# Patient Record
Sex: Female | Born: 1965 | Hispanic: No | Marital: Married | Smoking: Never smoker
Health system: Southern US, Community
[De-identification: ages and names within clinical notes are randomized; demographics above are authoritative.]

---

## 2011-01-16 ENCOUNTER — Other Ambulatory Visit: Payer: Self-pay | Admitting: Obstetrics & Gynecology

## 2011-01-16 ENCOUNTER — Ambulatory Visit (HOSPITAL_COMMUNITY)
Admission: RE | Admit: 2011-01-16 | Discharge: 2011-01-16 | Disposition: A | Discharge status: Home / Self Care | Payer: Self-pay | Source: Ambulatory Visit

## 2011-01-16 ENCOUNTER — Other Ambulatory Visit (HOSPITAL_COMMUNITY): Payer: Self-pay

## 2011-01-16 ENCOUNTER — Ambulatory Visit (HOSPITAL_COMMUNITY): Payer: Self-pay

## 2011-01-16 ENCOUNTER — Inpatient Hospital Stay (HOSPITAL_COMMUNITY)
Admission: AD | Admit: 2011-01-16 | Discharge: 2011-01-16 | Disposition: A | Discharge status: Home / Self Care | Payer: Self-pay | Source: Ambulatory Visit | Attending: Obstetrics & Gynecology | Admitting: Obstetrics & Gynecology

## 2011-01-16 DIAGNOSIS — R58 Hemorrhage, not elsewhere classified: Secondary | ICD-10-CM

## 2011-01-16 DIAGNOSIS — N938 Other specified abnormal uterine and vaginal bleeding: Secondary | ICD-10-CM | POA: Insufficient documentation

## 2011-01-16 DIAGNOSIS — N949 Unspecified condition associated with female genital organs and menstrual cycle: Secondary | ICD-10-CM | POA: Insufficient documentation

## 2011-01-16 LAB — URINALYSIS, ROUTINE W REFLEX MICROSCOPIC
Bilirubin Urine: NEGATIVE
Hgb urine dipstick: NEGATIVE
Ketones, ur: NEGATIVE mg/dL
Nitrite: NEGATIVE
Protein, ur: NEGATIVE mg/dL
Specific Gravity, Urine: 1.02 (ref 1.005–1.030)
Urine Glucose, Fasting: NEGATIVE mg/dL
Urobilinogen, UA: 0.2 mg/dL (ref 0.0–1.0)
pH: 5 (ref 5.0–8.0)

## 2011-01-16 LAB — WET PREP, GENITAL: Clue Cells Wet Prep HPF POC: NONE SEEN

## 2011-01-17 ENCOUNTER — Other Ambulatory Visit (HOSPITAL_COMMUNITY): Payer: Self-pay

## 2011-01-17 DIAGNOSIS — R58 Hemorrhage, not elsewhere classified: Secondary | ICD-10-CM

## 2011-01-17 LAB — GC/CHLAMYDIA PROBE AMP, GENITAL
Chlamydia, DNA Probe: NEGATIVE
GC Probe Amp, Genital: NEGATIVE

## 2011-03-27 ENCOUNTER — Emergency Department (HOSPITAL_COMMUNITY)
Admission: EM | Admit: 2011-03-27 | Discharge: 2011-03-28 | Disposition: A | Discharge status: Home / Self Care | Payer: Self-pay | Attending: Emergency Medicine | Admitting: Emergency Medicine

## 2011-03-27 ENCOUNTER — Emergency Department (HOSPITAL_COMMUNITY): Payer: Self-pay

## 2011-03-27 DIAGNOSIS — N898 Other specified noninflammatory disorders of vagina: Secondary | ICD-10-CM | POA: Insufficient documentation

## 2011-03-27 DIAGNOSIS — I498 Other specified cardiac arrhythmias: Secondary | ICD-10-CM | POA: Insufficient documentation

## 2011-03-27 DIAGNOSIS — R4182 Altered mental status, unspecified: Secondary | ICD-10-CM | POA: Insufficient documentation

## 2011-03-27 DIAGNOSIS — R55 Syncope and collapse: Secondary | ICD-10-CM | POA: Insufficient documentation

## 2011-03-27 LAB — COMPREHENSIVE METABOLIC PANEL
ALT: 32 U/L (ref 0–35)
AST: 28 U/L (ref 0–37)
BUN: 14 mg/dL (ref 6–23)
Creatinine, Ser: 0.98 mg/dL (ref 0.4–1.2)
GFR calc non Af Amer: >60 mL/min (ref >60)
Glucose, Bld: 103 mg/dL — ABNORMAL HIGH (ref 70–99)
Potassium: 3.5 mEq/L (ref 3.5–5.1)

## 2011-03-27 LAB — CBC
MCHC: 35.1 g/dL (ref 30.0–36.0)
Platelets: 295 10*3/uL (ref 150–400)
RDW: 13.7 % (ref 11.5–15.5)
WBC: 6.4 10*3/uL (ref 4.0–10.5)

## 2011-03-27 LAB — URINALYSIS, ROUTINE W REFLEX MICROSCOPIC
Bilirubin Urine: NEGATIVE
Hgb urine dipstick: NEGATIVE
Specific Gravity, Urine: 1.017 (ref 1.005–1.030)
Urobilinogen, UA: 0.2 mg/dL (ref 0.0–1.0)

## 2011-03-27 LAB — DIFFERENTIAL
Basophils Absolute: 0 10*3/uL (ref 0.0–0.1)
Basophils Relative: 0 % (ref 0–1)
Eosinophils Absolute: 0.2 10*3/uL (ref 0.0–0.7)
Eosinophils Relative: 3 % (ref 0–5)
Monocytes Absolute: 0.6 10*3/uL (ref 0.1–1.0)

## 2011-03-27 LAB — CK TOTAL AND CKMB (NOT AT ARMC)
CK, MB: 1.1 ng/mL (ref 0.3–4.0)
Total CK: 77 U/L (ref 7–177)

## 2011-03-28 LAB — GLUCOSE, CAPILLARY: Glucose-Capillary: 155 mg/dL — ABNORMAL HIGH (ref 70–99)

## 2013-08-19 ENCOUNTER — Encounter (HOSPITAL_COMMUNITY): Payer: Self-pay | Admitting: *Deleted

## 2013-08-19 ENCOUNTER — Emergency Department (HOSPITAL_COMMUNITY)
Admission: EM | Admit: 2013-08-19 | Discharge: 2013-08-19 | Disposition: A | Discharge status: Home / Self Care | Payer: Self-pay | Attending: Emergency Medicine | Admitting: Emergency Medicine

## 2013-08-19 DIAGNOSIS — J029 Acute pharyngitis, unspecified: Secondary | ICD-10-CM | POA: Insufficient documentation

## 2013-08-19 DIAGNOSIS — Z8739 Personal history of other diseases of the musculoskeletal system and connective tissue: Secondary | ICD-10-CM | POA: Insufficient documentation

## 2013-08-19 DIAGNOSIS — R070 Pain in throat: Secondary | ICD-10-CM

## 2013-08-19 DIAGNOSIS — M542 Cervicalgia: Secondary | ICD-10-CM | POA: Insufficient documentation

## 2013-08-19 DIAGNOSIS — M79609 Pain in unspecified limb: Secondary | ICD-10-CM | POA: Insufficient documentation

## 2013-08-19 DIAGNOSIS — M79604 Pain in right leg: Secondary | ICD-10-CM

## 2013-08-19 MED ORDER — GI COCKTAIL ~~LOC~~
30.0000 mL | Freq: Once | ORAL | Status: AC
  Administered 2013-08-19: 30 mL via ORAL
  Filled 2013-08-19: qty 30

## 2013-08-19 MED ORDER — CELECOXIB 200 MG PO CAPS: 200.0000 mg | ORAL_CAPSULE | Freq: Two times a day (BID) | ORAL | Status: DC

## 2013-08-19 MED ORDER — FAMOTIDINE 20 MG PO TABS
20.0000 mg | ORAL_TABLET | Freq: Two times a day (BID) | ORAL | Status: DC
  Administered 2013-08-19: 20 mg via ORAL
  Filled 2013-08-19: qty 1

## 2013-08-19 MED ORDER — CELECOXIB 200 MG PO CAPS
200.0000 mg | ORAL_CAPSULE | Freq: Once | ORAL | Status: AC
  Administered 2013-08-19: 200 mg via ORAL
  Filled 2013-08-19: qty 1

## 2013-08-19 MED ORDER — FAMOTIDINE 20 MG PO TABS: 20.0000 mg | ORAL_TABLET | Freq: Two times a day (BID) | ORAL | Status: DC

## 2013-08-19 NOTE — ED Notes (Signed)
Pt states that she has been having neck / throat pain x 5 days; pt states it is difficult to swallow and the left side hurts more than the right; c/o bilateral ear pain from cold air at times; also c/o rt leg pain; pt states the pain is to the back of the ankle extending up the calf; pain is worse when walking up or down stairs and rt hip pain; no known injury.

## 2013-08-19 NOTE — ED Provider Notes (Signed)
CSN: [id number]     Arrival date & time 08/19/13  0141 History   First MD Initiated Contact with Patient 08/19/13 0210     Chief Complaint  Patient presents with  . Neck Pain  . Leg Pain   (Consider location/radiation/quality/duration/timing/severity/associated sxs/prior Treatment) HPI 47 yo female presents to the ER with complaint of 5 days of sore throat with swallowing and moving her neck and several months history of right ankle and hip pain.  Patient reports she mainly notices the pain when she is up and down stairs.  No trauma to the right lower extremity.  Known.  No medications taken prior to arrival.  No sick contacts, no fevers, no Raynaud's, cough, or congestion.  She notices a sore throat issues.  She worse in the mornings.  She occasionally has a sour acid taste in the back of her mouth.  It sometimes hurts to swallow.  She is not a smoker.  History reviewed. No pertinent past medical history. History reviewed. No pertinent past surgical history. No family history on file. History  Substance Use Topics  . Smoking status: Never Smoker   . Smokeless tobacco: Not on file  . Alcohol Use: No   OB History   Grav Para Term Preterm Abortions TAB SAB Ect Mult Living                 Review of Systems  All other systems reviewed and are negative.   other than listed in history of present illness  Allergies  Review of patient's allergies indicates no known allergies.  Home Medications   Current Outpatient Rx  Name  Route  Sig  Dispense  Refill  . celecoxib (CELEBREX) 200 MG capsule   Oral   Take 1 capsule (200 mg total) by mouth 2 (two) times daily.   30 capsule   0   . famotidine (PEPCID) 20 MG tablet   Oral   Take 1 tablet (20 mg total) by mouth 2 (two) times daily.   30 tablet   0    BP 141/77  Pulse 72  Temp(Src) 98.7 F (37.1 C) (Oral)  Resp 20  Wt 167 lb (75.751 kg)  SpO2 100% Physical Exam  Nursing note and vitals reviewed. Constitutional: She is  oriented to person, place, and time. She appears well-developed and well-nourished.  HENT:  Head: Normocephalic and atraumatic.  Nose: Nose normal.  Mouth/Throat: Oropharynx is clear and moist. No oropharyngeal exudate.  Mild erythema to posterior pharynx without exudate.  Patient is swallowing without difficulties.  No trismus  Eyes: Conjunctivae and EOM are normal. Pupils are equal, round, and reactive to light.  Neck: Normal range of motion. Neck supple. No JVD present. No tracheal deviation present. No thyromegaly present.  Cardiovascular: Normal rate, regular rhythm, normal heart sounds and intact distal pulses.  Exam reveals no gallop and no friction rub.   No murmur heard. Pulmonary/Chest: Effort normal and breath sounds normal. No stridor. No respiratory distress. She has no wheezes. She has no rales. She exhibits no tenderness.  Abdominal: Soft. Bowel sounds are normal. She exhibits no distension and no mass. There is no tenderness. There is no rebound and no guarding.  Musculoskeletal: Normal range of motion. She exhibits no edema and no tenderness.  Patient has normal range of motion of the right lower extremity.  No pain with palpation of the right Achilles tendon.  There is no pain with range of motion of the hip.  No crepitus.  Unable  to reproduce patient's reported pain  Lymphadenopathy:    She has no cervical adenopathy.  Neurological: She is alert and oriented to person, place, and time. She exhibits normal muscle tone. Coordination normal.  Skin: Skin is warm and dry. No rash noted. No erythema. No pallor.  Psychiatric: She has a normal mood and affect. Her behavior is normal. Judgment and thought content normal.    ED Course  Procedures (including critical care time) Labs Review Labs Reviewed - No data to display Imaging Review No results found.  MDM   1. Throat pain   2. Lower extremity pain, right    47 year old female with several day history of throat pain.   Her exam is fairly benign other than some mild erythema to the posterior pharynx.  Symptoms are worse after sleeping all night.  She may have some reflux esophagitis.  We'll start on Pepcid.  Unable to reproduce pain in right lower extremity, we'll plan on starting her on Celebrex.  She's been given outpatient referrals.   Olivia Mackie, MD 08/20/13 [phone]

## 2014-05-27 ENCOUNTER — Emergency Department (HOSPITAL_COMMUNITY)
Admission: EM | Admit: 2014-05-27 | Discharge: 2014-05-28 | Disposition: A | Discharge status: Home / Self Care | Payer: Self-pay | Attending: Emergency Medicine | Admitting: Emergency Medicine

## 2014-05-27 ENCOUNTER — Encounter (HOSPITAL_COMMUNITY): Payer: Self-pay | Admitting: Emergency Medicine

## 2014-05-27 DIAGNOSIS — M542 Cervicalgia: Secondary | ICD-10-CM | POA: Insufficient documentation

## 2014-05-27 LAB — BASIC METABOLIC PANEL
BUN: 18 mg/dL (ref 6–23)
CALCIUM: 9.7 mg/dL (ref 8.4–10.5)
CO2: 25 meq/L (ref 19–32)
CREATININE: 0.9 mg/dL (ref 0.50–1.10)
Chloride: 102 mEq/L (ref 96–112)
GFR, EST AFRICAN AMERICAN: 87 mL/min — AB (ref >90)
GFR, EST NON AFRICAN AMERICAN: 75 mL/min — AB (ref >90)
Glucose, Bld: 107 mg/dL — ABNORMAL HIGH (ref 70–99)
Potassium: 4.1 mEq/L (ref 3.7–5.3)
SODIUM: 141 meq/L (ref 137–147)

## 2014-05-27 LAB — CBC WITH DIFFERENTIAL/PLATELET
BASOS ABS: 0 10*3/uL (ref 0.0–0.1)
BASOS PCT: 1 % (ref 0–1)
EOS ABS: 0.4 10*3/uL (ref 0.0–0.7)
EOS PCT: 4 % (ref 0–5)
HEMATOCRIT: 37.5 % (ref 36.0–46.0)
Hemoglobin: 12.9 g/dL (ref 12.0–15.0)
Lymphocytes Relative: 30 % (ref 12–46)
Lymphs Abs: 2.4 10*3/uL (ref 0.7–4.0)
MCH: 29.1 pg (ref 26.0–34.0)
MCHC: 34.4 g/dL (ref 30.0–36.0)
MCV: 84.5 fL (ref 78.0–100.0)
MONO ABS: 0.7 10*3/uL (ref 0.1–1.0)
Monocytes Relative: 9 % (ref 3–12)
Neutro Abs: 4.6 10*3/uL (ref 1.7–7.7)
Neutrophils Relative %: 56 % (ref 43–77)
PLATELETS: 386 10*3/uL (ref 150–400)
RBC: 4.44 MIL/uL (ref 3.87–5.11)
RDW: 13.5 % (ref 11.5–15.5)
WBC: 8.1 10*3/uL (ref 4.0–10.5)

## 2014-05-27 LAB — I-STAT CG4 LACTIC ACID, ED: LACTIC ACID, VENOUS: 0.72 mmol/L (ref 0.5–2.2)

## 2014-05-27 NOTE — ED Notes (Signed)
Pt c/o pain just lateral to trachea, area slightly swollen and very tender x 6 weeks, pt c/o diff breathing and swallowing when she lays down at night. Pt states pain now radiates to L ear and down into chest. Pt denies n/v/d.

## 2014-05-27 NOTE — ED Notes (Addendum)
CULTURAL NOTE: IF PT IS UNDRESSING, ALL MALES NEED TO LEAVE THE ROOM. SO AS NOT TO OFFEND THE PT, FEMALES ARE PREFERRED FOR ALL TESTS AND PROCEDURES

## 2014-05-28 ENCOUNTER — Encounter (HOSPITAL_COMMUNITY): Payer: Self-pay

## 2014-05-28 ENCOUNTER — Emergency Department (HOSPITAL_COMMUNITY): Payer: Self-pay

## 2014-05-28 LAB — T4, FREE: Free T4: 1.22 ng/dL (ref 0.80–1.80)

## 2014-05-28 LAB — TSH: TSH: 0.469 u[IU]/mL (ref 0.350–4.500)

## 2014-05-28 MED ORDER — HYDROCODONE-ACETAMINOPHEN 5-325 MG PO TABS: 1.0000 | ORAL_TABLET | ORAL | Status: DC | PRN

## 2014-05-28 MED ORDER — IOHEXOL 300 MG/ML  SOLN
100.0000 mL | Freq: Once | INTRAMUSCULAR | Status: AC | PRN
  Administered 2014-05-28: 100 mL via INTRAVENOUS

## 2014-05-28 NOTE — ED Notes (Signed)
Patient transported to CT 

## 2014-05-28 NOTE — ED Provider Notes (Signed)
Medical screening examination/treatment/procedure(s) were performed by non-physician practitioner and as supervising physician I was immediately available for consultation/collaboration.   EKG Interpretation None        Lyanne CoKevin M Campos, MD 05/28/14 [phone]

## 2014-05-28 NOTE — Discharge Instructions (Signed)
Take the prescribed medication as directed. Follow-up with Dr. Lynelle DoctorKnapp to discuss lab results and to follow along with neck pain. Return to the ED for new or worsening symptoms.

## 2014-05-28 NOTE — ED Provider Notes (Signed)
CSN: [id number]     Arrival date & time 05/27/14  2053 History   First MD Initiated Contact with Patient 05/27/14 2306     Chief Complaint  Patient presents with  . neck swelling      (Consider location/radiation/quality/duration/timing/severity/associated sxs/prior Treatment) The history is provided by the patient and medical records.   This is a 48 year old female with no significant past medical history presenting to the ED for painful "knot" to the left side of her neck. She states over the past 6 weeks it has gotten progressively larger in size and is now very tender to palpation. Patient states when she lays down flat in her bed to sleep at night she feels that she has some difficulty breathing and swallowing but symptoms resolve upon sitting upright.  No pain with swallowing.  No fever or chills.  No chest pain or SOB.  Patient has no PMH.  She does not have a PCP at this time.  History reviewed. No pertinent past medical history. History reviewed. No pertinent past surgical history. No family history on file. History  Substance Use Topics  . Smoking status: Never Smoker   . Smokeless tobacco: Not on file  . Alcohol Use: No   OB History   Grav Para Term Preterm Abortions TAB SAB Ect Mult Living                 Review of Systems  Musculoskeletal: Positive for neck pain (nodule).  All other systems reviewed and are negative.     Allergies  Chloroquine  Home Medications   Prior to Admission medications   Medication Sig Start Date End Date Taking? Authorizing Provider  amoxicillin (AMOXIL) 250 MG capsule Take 250 mg by mouth daily. 7 day therapy patient completed on 05/24/2014   Yes Historical Provider, MD   BP 140/73  Pulse 74  Temp(Src) 98.2 F (36.8 C) (Oral)  Resp 20  Ht 5' (1.524 m)  Wt 170 lb (77.111 kg)  BMI 33.20 kg/m2  SpO2 100%  Physical Exam  Nursing note and vitals reviewed. Constitutional: She is oriented to person, place, and time. She appears  well-developed and well-nourished. No distress.  HENT:  Head: Normocephalic and atraumatic.  Right Ear: Tympanic membrane and ear canal normal.  Left Ear: Tympanic membrane and ear canal normal.  Nose: Nose normal.  Mouth/Throat: Uvula is midline, oropharynx is clear and moist and mucous membranes are normal. No oropharyngeal exudate, posterior oropharyngeal edema, posterior oropharyngeal erythema or tonsillar abscesses.  Uvula midline; Airway patent  Eyes: Conjunctivae and EOM are normal. Pupils are equal, round, and reactive to light.  Neck: Normal range of motion. Neck supple.    Small cystic structure of left side of neck that is locally tender; no overlying erythema, induration, or warmth to touch; full ROM of neck without difficulty  Cardiovascular: Normal rate, regular rhythm and normal heart sounds.   Pulmonary/Chest: Effort normal and breath sounds normal. No respiratory distress. She has no wheezes.  Musculoskeletal: Normal range of motion.  Neurological: She is alert and oriented to person, place, and time.  Skin: Skin is warm and dry. She is not diaphoretic.  Psychiatric: She has a normal mood and affect.    ED Course  Procedures (including critical care time) Labs Review Labs Reviewed  BASIC METABOLIC PANEL - Abnormal; Notable for the following:    Glucose, Bld 107 (*)    GFR calc non Af Amer 75 (*)    GFR calc Af Amer 87 (*)  All other components within normal limits  CBC WITH DIFFERENTIAL  TSH  T4, FREE  I-STAT CG4 LACTIC ACID, ED    Imaging Review Ct Soft Tissue Neck W Contrast  05/28/2014   CLINICAL DATA:  Painful swollen area 2 left-sided neck.  EXAM: CT NECK WITH CONTRAST  TECHNIQUE: Multidetector CT imaging of the neck was performed using the standard protocol following the bolus administration of intravenous contrast.  CONTRAST:  100mL OMNIPAQUE IOHEXOL 300 MG/ML  SOLN  COMPARISON:  None.  FINDINGS: Visualized portions of the brain are unremarkable. Orbits  are not well visualized.  The visualized paranasal sinuses and mastoid air cells are clear.  The salivary glands including the parotid glands and submandibular glands are within normal limits.  Minimal asymmetric enlargement noted within the left palatine tonsil. Punctate calcified tonsillith noted within the right palatine tonsil. The tonsils are otherwise unremarkable. Parapharyngeal fat is preserved. The nasopharynx and oropharynx are within normal limits. No retropharyngeal fluid collection. The hypopharynx and supraglottic larynx are normal. True vocal cords are symmetric. Subglottic airway is clear.  No cervical adenopathy identified. No mass lesion or loculated fluid collection identified within the neck.  Probable subcentimeter hypodense nodules noted within the thyroid gland bilaterally. Thyroid gland is otherwise unremarkable.  Visualized superior mediastinum within normal limits.  Visualized lung apices are clear.  Normal intravascular enhancement seen throughout the neck.  No acute osseous abnormality. No worrisome lytic or blastic osseous lesions.  IMPRESSION: Unremarkable CT of the neck with no mass lesion, adenopathy, or loculated fluid collection identified.   Electronically Signed   By: Rise MuBenjamin  McClintock M.D.   On: 05/28/2014 01:53     EKG Interpretation None      MDM   Final diagnoses:  Neck pain on left side   48 year old female presenting to the ED for left-sided external neck for the past 6 weeks. On exam, she does appear to have a small cystic structure just left of her trachea. There is no trachea deviation or airway compromise. She has no difficulty swallowing or speaking.  Given that this area has now become locally tender, concern for possible mass, thyroid nodule, or thyroiditis. Basic labs were obtained which are reassuring.  TSH/T4 pending.  Will obtain CT soft tissue of neck with contrast for further evaluation.  On CT patient was noted to have probable subcentimeter  nodules of the thyroid gland, however no visible mass, adenopathy or other concerning findings.  On reevaluation, patient was noted to be lying flat in bed without difficulty breathing or swallowing which she stated she had previously experienced.  Patient does not have a primary care physician at this time, however I have encouraged her to have close followup for monitoring of symptoms and to FU on labs.  Referrals provided for female physicians as pt does not want to see a female provider.  Rx vicodin for pain.  Discussed plan with patient, he/she acknowledged understanding and agreed with plan of care.  Return precautions given for new or worsening symptoms.  Garlon HatchetLisa M Sanders, PA-C 05/28/14 0336  Garlon HatchetLisa M Sanders, PA-C 05/28/14 [phone]

## 2014-06-06 ENCOUNTER — Encounter (HOSPITAL_COMMUNITY): Payer: Self-pay | Admitting: Emergency Medicine

## 2014-06-06 ENCOUNTER — Emergency Department (INDEPENDENT_AMBULATORY_CARE_PROVIDER_SITE_OTHER)
Admission: EM | Admit: 2014-06-06 | Discharge: 2014-06-06 | Disposition: A | Discharge status: Home / Self Care | Payer: Self-pay | Source: Home / Self Care | Attending: Family Medicine | Admitting: Family Medicine

## 2014-06-06 DIAGNOSIS — M542 Cervicalgia: Secondary | ICD-10-CM

## 2014-06-06 NOTE — Discharge Instructions (Signed)
You may want to go to [facility] for further eval.

## 2014-06-06 NOTE — ED Notes (Signed)
Pt triaged and assessed by provider.   Provider in before nurse. 

## 2014-06-06 NOTE — ED Provider Notes (Signed)
CSN: [id number]     Arrival date & time 06/06/14  1132 History   First MD Initiated Contact with Patient 06/06/14 1140     Chief Complaint  Patient presents with  . Neck Pain   (Consider location/radiation/quality/duration/timing/severity/associated sxs/prior Treatment) Patient is a 48 y.o. female presenting with neck pain. The history is provided by the patient.  Neck Pain Pain location:  L side Quality:  Shooting Pain severity:  Moderate Onset quality:  Gradual Duration:  2 months Progression:  Worsening Chronicity:  New Context comment:  [facility], no etiol found, pt states sx continue. Relieved by:  Nothing Ineffective treatments: no relief with narcotic. Associated symptoms: no fever     History reviewed. No pertinent past medical history. History reviewed. No pertinent past surgical history. History reviewed. No pertinent family history. History  Substance Use Topics  . Smoking status: Never Smoker   . Smokeless tobacco: Not on file  . Alcohol Use: No   OB History   Grav Para Term Preterm Abortions TAB SAB Ect Mult Living                 Review of Systems  Constitutional: Negative.  Negative for fever.  HENT: Negative for congestion.   Respiratory: Negative for shortness of breath and wheezing.   Cardiovascular: Negative.   Gastrointestinal: Negative.   Musculoskeletal: Positive for neck pain.  Skin: Negative.     Allergies  Chloroquine  Home Medications   Prior to Admission medications   Medication Sig Start Date End Date Taking? Authorizing Olena Willy  amoxicillin (AMOXIL) 250 MG capsule Take 250 mg by mouth daily. 7 day therapy patient completed on 05/24/2014   Yes Historical Arleta Ostrum, MD  HYDROcodone-acetaminophen (NORCO/VICODIN) 5-325 MG per tablet Take 1 tablet by mouth every 4 (four) hours as needed. 05/28/14  Yes Garlon HatchetLisa M Sanders, PA-C   BP 126/81  Pulse 71  Temp(Src) 100.8 F (38.2 C) (Oral)  Resp 20  SpO2 98% Physical Exam    Nursing note and vitals reviewed. Constitutional: She is oriented to person, place, and time. She appears well-developed and well-nourished.  HENT:  Right Ear: External ear normal.  Left Ear: External ear normal.  Mouth/Throat: Oropharynx is clear and moist.  Eyes: Pupils are equal, round, and reactive to light.  Neck: Trachea normal and normal range of motion. Neck supple. Carotid bruit is not present. No tracheal deviation present. No mass present.    Pulmonary/Chest: Effort normal and breath sounds normal.  Neurological: She is alert and oriented to person, place, and time.  Skin: Skin is warm and dry.    ED Course  Procedures (including critical care time) Labs Review Labs Reviewed - No data to display  Imaging Review No results found.   MDM   1. Anterior neck pain        Linna HoffJames D Kindl, MD 06/06/14 1208

## 2014-06-30 ENCOUNTER — Emergency Department (HOSPITAL_BASED_OUTPATIENT_CLINIC_OR_DEPARTMENT_OTHER): Payer: Self-pay

## 2014-06-30 ENCOUNTER — Encounter (HOSPITAL_BASED_OUTPATIENT_CLINIC_OR_DEPARTMENT_OTHER): Payer: Self-pay | Admitting: Emergency Medicine

## 2014-06-30 ENCOUNTER — Emergency Department (HOSPITAL_BASED_OUTPATIENT_CLINIC_OR_DEPARTMENT_OTHER)
Admission: EM | Admit: 2014-06-30 | Discharge: 2014-06-30 | Disposition: A | Discharge status: Home / Self Care | Payer: Self-pay | Attending: Emergency Medicine | Admitting: Emergency Medicine

## 2014-06-30 DIAGNOSIS — Z792 Long term (current) use of antibiotics: Secondary | ICD-10-CM | POA: Insufficient documentation

## 2014-06-30 DIAGNOSIS — M542 Cervicalgia: Secondary | ICD-10-CM | POA: Insufficient documentation

## 2014-06-30 LAB — CBC WITH DIFFERENTIAL/PLATELET
BASOS PCT: 0 % (ref 0–1)
Basophils Absolute: 0 10*3/uL (ref 0.0–0.1)
EOS ABS: 0.1 10*3/uL (ref 0.0–0.7)
EOS PCT: 1 % (ref 0–5)
HCT: 33.5 % — ABNORMAL LOW (ref 36.0–46.0)
Hemoglobin: 11.2 g/dL — ABNORMAL LOW (ref 12.0–15.0)
LYMPHS ABS: 1.5 10*3/uL (ref 0.7–4.0)
Lymphocytes Relative: 16 % (ref 12–46)
MCH: 27.9 pg (ref 26.0–34.0)
MCHC: 33.4 g/dL (ref 30.0–36.0)
MCV: 83.3 fL (ref 78.0–100.0)
Monocytes Absolute: 1.2 10*3/uL — ABNORMAL HIGH (ref 0.1–1.0)
Monocytes Relative: 12 % (ref 3–12)
Neutro Abs: 7 10*3/uL (ref 1.7–7.7)
Neutrophils Relative %: 71 % (ref 43–77)
PLATELETS: 482 10*3/uL — AB (ref 150–400)
RBC: 4.02 MIL/uL (ref 3.87–5.11)
RDW: 12.6 % (ref 11.5–15.5)
WBC: 9.8 10*3/uL (ref 4.0–10.5)

## 2014-06-30 LAB — BASIC METABOLIC PANEL
Anion gap: 13 (ref 5–15)
BUN: 9 mg/dL (ref 6–23)
CALCIUM: 10 mg/dL (ref 8.4–10.5)
CO2: 26 mEq/L (ref 19–32)
Chloride: 102 mEq/L (ref 96–112)
Creatinine, Ser: 0.6 mg/dL (ref 0.50–1.10)
GFR calc Af Amer: >90 mL/min (ref >90)
GLUCOSE: 105 mg/dL — AB (ref 70–99)
Potassium: 3.9 mEq/L (ref 3.7–5.3)
SODIUM: 141 meq/L (ref 137–147)

## 2014-06-30 MED ORDER — CEPHALEXIN 500 MG PO CAPS: 500.0000 mg | ORAL_CAPSULE | Freq: Four times a day (QID) | ORAL | Status: DC

## 2014-06-30 MED ORDER — ONDANSETRON HCL 4 MG/2ML IJ SOLN
INTRAMUSCULAR | Status: AC
  Filled 2014-06-30: qty 2

## 2014-06-30 MED ORDER — IBUPROFEN 400 MG PO TABS
600.0000 mg | ORAL_TABLET | Freq: Once | ORAL | Status: AC
  Administered 2014-06-30: 600 mg via ORAL
  Filled 2014-06-30 (×2): qty 1

## 2014-06-30 MED ORDER — IOHEXOL 300 MG/ML  SOLN
75.0000 mL | Freq: Once | INTRAMUSCULAR | Status: AC | PRN
  Administered 2014-06-30: 75 mL via INTRAVENOUS

## 2014-06-30 MED ORDER — ONDANSETRON HCL 4 MG/2ML IJ SOLN: 4.0000 mg | Freq: Once | INTRAMUSCULAR | Status: DC

## 2014-06-30 NOTE — ED Provider Notes (Signed)
CSN: [id number]     Arrival date & time 06/30/14  [id number] History   First MD Initiated Contact with Patient 06/30/14 1018     Chief Complaint  Patient presents with  . Neck Pain     (Consider location/radiation/quality/duration/timing/severity/associated sxs/prior Treatment) HPI Comments: Patient is a 48 year old female with no significant past medical history. She presents today for evaluation of neck pain that has been ongoing for 2 months. Her discomfort is in the left anterior aspect of her neck. She has been seen several occasions and has had CT of the neck, laboratory studies, and thyroid studies. These to this point have been unremarkable. She states that her pain is worsening and she is now feeling a swelling. She also states that it feels difficult for her to swallow. She denies any fevers or chills. She denies any chest pain or shortness of breath.  Patient is a 48 y.o. female presenting with neck pain. The history is provided by the patient.  Neck Pain Pain location:  L side Quality:  Stabbing Radiates to: Left ear. Pain severity:  Moderate Pain is:  Same all the time Onset quality:  Gradual Duration:  2 months Timing:  Constant Progression:  Worsening Chronicity:  New Context: not fall   Relieved by:  Nothing Worsened by:  Nothing tried Ineffective treatments:  None tried   History reviewed. No pertinent past medical history. History reviewed. No pertinent past surgical history. No family history on file. History  Substance Use Topics  . Smoking status: Never Smoker   . Smokeless tobacco: Not on file  . Alcohol Use: No   OB History   Grav Para Term Preterm Abortions TAB SAB Ect Mult Living                 Review of Systems  Musculoskeletal: Positive for neck pain.  All other systems reviewed and are negative.     Allergies  Chloroquine  Home Medications   Prior to Admission medications   Medication Sig Start Date End Date Taking? Authorizing Provider   amoxicillin (AMOXIL) 250 MG capsule Take 250 mg by mouth daily. 7 day therapy patient completed on 05/24/2014    Historical Provider, MD  HYDROcodone-acetaminophen (NORCO/VICODIN) 5-325 MG per tablet Take 1 tablet by mouth every 4 (four) hours as needed. 05/28/14   Garlon HatchetLisa M Sanders, PA-C   BP 126/79  Pulse 89  Temp(Src) 98.6 F (37 C) (Oral)  Resp 18  Ht 5\' 6"  (1.676 m)  Wt 173 lb (78.472 kg)  BMI 27.94 kg/m2  SpO2 98% Physical Exam  Nursing note and vitals reviewed. Constitutional: She is oriented to person, place, and time. She appears well-developed and well-nourished. No distress.  HENT:  Head: Normocephalic and atraumatic.  Mouth/Throat: Oropharynx is clear and moist.  Neck: Normal range of motion. Neck supple.  There is a tender, full area to the left anterior neck. There are no definite masses or fluctuance. There is no thyromegaly that I can appreciate.  Cardiovascular: Normal rate and regular rhythm.  Exam reveals no gallop and no friction rub.   No murmur heard. Pulmonary/Chest: Effort normal and breath sounds normal. No respiratory distress. She has no wheezes.  Abdominal: Soft. Bowel sounds are normal. She exhibits no distension. There is no tenderness.  Musculoskeletal: Normal range of motion.  Neurological: She is alert and oriented to person, place, and time.  Skin: Skin is warm and dry. She is not diaphoretic.    ED Course  Procedures (including critical care  time) Labs Review Labs Reviewed  CBC WITH DIFFERENTIAL  BASIC METABOLIC PANEL    Imaging Review No results found.   EKG Interpretation None      MDM   Final diagnoses:  None    Patient presents here with complaints of neck pain. She had a CT scan performed one month ago which was unremarkable. She is continuing with pain in the left anterior aspect of her neck. Physical examination reveals tenderness in the area of the sternocleidomastoid muscle, however no significant masses or fluctuance. CT  scan reveals no evidence for acute pathology. There are several slightly enlarged lymph nodes. As I found no other pathology to explain the degree of her discomfort, I will prescribe Keflex for presumed reactive lymph nodes. She is to followup as needed if not improving.    Geoffery Lyons, MD 06/30/14 [phone]

## 2014-06-30 NOTE — Discharge Instructions (Signed)
Keflex as prescribed.  Ibuprofen 600 mg every 6 hours as needed for pain.  Followup with your primary Dr. if not improving in the next week.   Pain of Unknown Etiology (Pain Without a Known Cause) You have come to your caregiver because of pain. Pain can occur in any part of the body. Often there is not a definite cause. If your laboratory (blood or urine) work was normal and X-rays or other studies were normal, your caregiver may treat you without knowing the cause of the pain. An example of this is the headache. Most headaches are diagnosed by taking a history. This means your caregiver asks you questions about your headaches. Your caregiver determines a treatment based on your answers. Usually testing done for headaches is normal. Often testing is not done unless there is no response to medications. Regardless of where your pain is located today, you can be given medications to make you comfortable. If no physical cause of pain can be found, most cases of pain will gradually leave as suddenly as they came.  If you have a painful condition and no reason can be found for the pain, it is important that you follow up with your caregiver. If the pain becomes worse or does not go away, it may be necessary to repeat tests and look further for a possible cause.  Only take over-the-counter or prescription medicines for pain, discomfort, or fever as directed by your caregiver.  For the protection of your privacy, test results cannot be given over the phone. Make sure you receive the results of your test. Ask how these results are to be obtained if you have not been informed. It is your responsibility to obtain your test results.  You may continue all activities unless the activities cause more pain. When the pain lessens, it is important to gradually resume normal activities. Resume activities by beginning slowly and gradually increasing the intensity and duration of the activities or exercise. During  periods of severe pain, bed rest may be helpful. Lie or sit in any position that is comfortable.  Ice used for acute (sudden) conditions may be effective. Use a large plastic bag filled with ice and wrapped in a towel. This may provide pain relief.  See your caregiver for continued problems. Your caregiver can help or refer you for exercises or physical therapy if necessary. If you were given medications for your condition, do not drive, operate machinery or power tools, or sign legal documents for 24 hours. Do not drink alcohol, take sleeping pills, or take other medications that may interfere with treatment. See your caregiver immediately if you have pain that is becoming worse and not relieved by medications. Document Released: 08/22/2001 Document Revised: 09/17/2013 Document Reviewed: 11/27/2005 [facility] Patient Information 2015 [employer] [place]. This information is not intended to replace advice given to you by your health care provider. Make sure you discuss any questions you have with your health care provider.

## 2014-06-30 NOTE — ED Notes (Signed)
Pt reports a 2 month hx of neck pain and has been seen in ED several times for the same; workup has essentially been negative.  Pain has been worse the past few days and has fatigue, intermittent palpitations, exertional SOB, difficulty swallowing and eating.  Also has frequent nightly fevers (unmeasured) and sweats.  Palpable, tender nodule on right side of neck.

## 2014-07-27 ENCOUNTER — Ambulatory Visit: Payer: Self-pay

## 2015-04-30 ENCOUNTER — Ambulatory Visit: Payer: Self-pay | Attending: Internal Medicine

## 2016-06-30 ENCOUNTER — Encounter (HOSPITAL_BASED_OUTPATIENT_CLINIC_OR_DEPARTMENT_OTHER): Payer: Self-pay | Admitting: Emergency Medicine

## 2016-06-30 ENCOUNTER — Emergency Department (HOSPITAL_BASED_OUTPATIENT_CLINIC_OR_DEPARTMENT_OTHER)
Admission: EM | Admit: 2016-06-30 | Discharge: 2016-07-01 | Disposition: A | Discharge status: Home / Self Care | Payer: Self-pay | Attending: Dermatology | Admitting: Dermatology

## 2016-06-30 DIAGNOSIS — Z5321 Procedure and treatment not carried out due to patient leaving prior to being seen by health care provider: Secondary | ICD-10-CM | POA: Insufficient documentation

## 2016-06-30 DIAGNOSIS — M79641 Pain in right hand: Secondary | ICD-10-CM | POA: Insufficient documentation

## 2016-06-30 NOTE — ED Notes (Signed)
patient states that she is having right hand numbness to her right hand and arm. Denies any new symptoms, reports that the numbness is getting worse every day.

## 2016-07-11 ENCOUNTER — Encounter (HOSPITAL_BASED_OUTPATIENT_CLINIC_OR_DEPARTMENT_OTHER): Payer: Self-pay | Admitting: *Deleted

## 2016-07-11 ENCOUNTER — Emergency Department (HOSPITAL_BASED_OUTPATIENT_CLINIC_OR_DEPARTMENT_OTHER)
Admission: EM | Admit: 2016-07-11 | Discharge: 2016-07-11 | Disposition: A | Discharge status: Home / Self Care | Payer: Self-pay | Attending: Emergency Medicine | Admitting: Emergency Medicine

## 2016-07-11 ENCOUNTER — Emergency Department (HOSPITAL_BASED_OUTPATIENT_CLINIC_OR_DEPARTMENT_OTHER): Payer: Self-pay

## 2016-07-11 DIAGNOSIS — G5601 Carpal tunnel syndrome, right upper limb: Secondary | ICD-10-CM | POA: Insufficient documentation

## 2016-07-11 DIAGNOSIS — M7661 Achilles tendinitis, right leg: Secondary | ICD-10-CM | POA: Insufficient documentation

## 2016-07-11 DIAGNOSIS — R079 Chest pain, unspecified: Secondary | ICD-10-CM

## 2016-07-11 DIAGNOSIS — R0789 Other chest pain: Secondary | ICD-10-CM | POA: Insufficient documentation

## 2016-07-11 DIAGNOSIS — R1013 Epigastric pain: Secondary | ICD-10-CM | POA: Insufficient documentation

## 2016-07-11 LAB — CBC WITH DIFFERENTIAL/PLATELET
BASOS PCT: 0 %
Basophils Absolute: 0 10*3/uL (ref 0.0–0.1)
EOS PCT: 6 %
Eosinophils Absolute: 0.4 10*3/uL (ref 0.0–0.7)
HCT: 37.9 % (ref 36.0–46.0)
Hemoglobin: 13.1 g/dL (ref 12.0–15.0)
Lymphocytes Relative: 38 %
Lymphs Abs: 2.5 10*3/uL (ref 0.7–4.0)
MCH: 29.2 pg (ref 26.0–34.0)
MCHC: 34.6 g/dL (ref 30.0–36.0)
MCV: 84.6 fL (ref 78.0–100.0)
MONO ABS: 0.6 10*3/uL (ref 0.1–1.0)
Monocytes Relative: 8 %
Neutro Abs: 3.1 10*3/uL (ref 1.7–7.7)
Neutrophils Relative %: 48 %
PLATELETS: 318 10*3/uL (ref 150–400)
RBC: 4.48 MIL/uL (ref 3.87–5.11)
RDW: 13.5 % (ref 11.5–15.5)
WBC: 6.6 10*3/uL (ref 4.0–10.5)

## 2016-07-11 LAB — COMPREHENSIVE METABOLIC PANEL
ALBUMIN: 3.5 g/dL (ref 3.5–5.0)
ALT: 13 U/L — ABNORMAL LOW (ref 14–54)
ANION GAP: 7 (ref 5–15)
AST: 18 U/L (ref 15–41)
Alkaline Phosphatase: 79 U/L (ref 38–126)
BUN: 16 mg/dL (ref 6–20)
CHLORIDE: 106 mmol/L (ref 101–111)
CO2: 25 mmol/L (ref 22–32)
Calcium: 9.2 mg/dL (ref 8.9–10.3)
Creatinine, Ser: 0.71 mg/dL (ref 0.44–1.00)
GFR calc Af Amer: >60 mL/min (ref >60)
Glucose, Bld: 108 mg/dL — ABNORMAL HIGH (ref 65–99)
POTASSIUM: 3.6 mmol/L (ref 3.5–5.1)
Sodium: 138 mmol/L (ref 135–145)
Total Bilirubin: 0.6 mg/dL (ref 0.3–1.2)
Total Protein: 7.1 g/dL (ref 6.5–8.1)

## 2016-07-11 LAB — LIPASE, BLOOD: Lipase: 55 U/L — ABNORMAL HIGH (ref 11–51)

## 2016-07-11 MED ORDER — IBUPROFEN 800 MG PO TABS
800.0000 mg | ORAL_TABLET | Freq: Once | ORAL | Status: AC
  Administered 2016-07-11: 800 mg via ORAL
  Filled 2016-07-11: qty 1

## 2016-07-11 MED ORDER — ACETAMINOPHEN 500 MG PO TABS
1000.0000 mg | ORAL_TABLET | Freq: Once | ORAL | Status: AC
  Administered 2016-07-11: 1000 mg via ORAL
  Filled 2016-07-11: qty 2

## 2016-07-11 MED ORDER — GI COCKTAIL ~~LOC~~
30.0000 mL | Freq: Once | ORAL | Status: AC
  Administered 2016-07-11: 30 mL via ORAL
  Filled 2016-07-11: qty 30

## 2016-07-11 NOTE — ED Provider Notes (Signed)
[facility] Provider Note   CSN: [id number] Arrival date & time: 07/11/16  1656  First Provider Contact:  None       History   Chief Complaint Chief Complaint  Patient presents with  . Chest Pain    HPI Rhonda Fernandez is a 50 y.o. female.  50  Yo F with a chief complaint of right-sided chest wall pain. The going on for the past couple days. Patient also has multiple other complaints. The chest pain is worse when she lies back flat and improves with sitting up. She denies fevers or chills. She denies exertional pain.  The pain is somewhat worse with twisting of her torso or moving her right arm. It is also worse with eating. She denies nausea or vomiting. Denies shortness of breath. Denies diaphoresis. Denies hypertension hyperlipidemia or diabetes. Denies smoking. Denies PE risk factors.   The history is provided by the patient.  Chest Pain   This is a new problem. The current episode started 2 days ago. The problem occurs constantly. The problem has not changed since onset.Associated with: Lying flat. Pain location: Right-sided. The pain is at a severity of 7/10. The pain is moderate. The quality of the pain is described as brief. The pain does not radiate. Duration of episode(s) is 1 hour (Sometimes lasts seconds but usually 1 hour max). The symptoms are aggravated by certain positions. Associated symptoms include abdominal pain and numbness (in first two digits of R hand). Pertinent negatives include no dizziness, no fever, no headaches, no nausea, no palpitations, no shortness of breath and no vomiting. She has tried nothing for the symptoms. The treatment provided no relief. There are no known risk factors.    History reviewed. No pertinent past medical history.  There are no active problems to display for this patient.   History reviewed. No pertinent surgical history.  OB History    No data available       Home Medications    Prior to Admission medications     Not on File    Family History No family history on file.  Social History Social History  Substance Use Topics  . Smoking status: Never Smoker  . Smokeless tobacco: Never Used  . Alcohol use No     Allergies   Chloroquine   Review of Systems Review of Systems  Constitutional: Negative for chills and fever.  HENT: Negative for congestion and rhinorrhea.   Eyes: Negative for redness and visual disturbance.  Respiratory: Negative for shortness of breath and wheezing.   Cardiovascular: Positive for chest pain. Negative for palpitations.  Gastrointestinal: Positive for abdominal pain. Negative for nausea and vomiting.  Genitourinary: Negative for dysuria and urgency.  Musculoskeletal: Positive for arthralgias and myalgias.  Skin: Negative for pallor and wound.  Neurological: Positive for numbness (in first two digits of R hand). Negative for dizziness and headaches.     Physical Exam Updated Vital Signs BP 120/79   Pulse (!) 50   Resp 20   SpO2 99%   Physical Exam  Constitutional: She is oriented to person, place, and time. She appears well-developed and well-nourished. No distress.  HENT:  Head: Normocephalic and atraumatic.  Eyes: EOM are normal. Pupils are equal, round, and reactive to light.  Neck: Normal range of motion. Neck supple.  Cardiovascular: Normal rate and regular rhythm.  Exam reveals no gallop and no friction rub.   No murmur heard. Pulmonary/Chest: Effort normal. She has no wheezes. She has no rales.  She exhibits tenderness (tender palpation about the right chest wall).  Abdominal: Soft. She exhibits no distension and no mass. There is tenderness (Mild epigastric, negative Murphys). There is no guarding.  Musculoskeletal: She exhibits tenderness. She exhibits no edema.  Tender about the Achilles tendon. Patient is able to plantarflex the ankle without difficulty. Thompson test with intact plantar flexion.  Neurological: She is alert and oriented to  person, place, and time.  Positive Tinel's test when tapping on the median canal.  Skin: Skin is warm and dry. She is not diaphoretic.  Psychiatric: She has a normal mood and affect. Her behavior is normal.  Nursing note and vitals reviewed.    ED Treatments / Results  Labs (all labs ordered are listed, but only abnormal results are displayed) Labs Reviewed  COMPREHENSIVE METABOLIC PANEL - Abnormal; Notable for the following:       Result Value   Glucose, Bld 108 (*)    ALT 13 (*)    All other components within normal limits  LIPASE, BLOOD - Abnormal; Notable for the following:    Lipase 55 (*)    All other components within normal limits  CBC WITH DIFFERENTIAL/PLATELET  CBC WITH DIFFERENTIAL/PLATELET    EKG  EKG Interpretation  Date/Time:  Tuesday July 11 2016 17:07:20 EDT Ventricular Rate:  51 PR Interval:    QRS Duration: 90 QT Interval:  441 QTC Calculation: 407 R Axis:   41 Text Interpretation:  Sinus rhythm Low voltage, precordial leads Abnormal R-wave progression, early transition No significant change since last tracing Confirmed by Loghan Subia MD, DANIEL [phone]) on 07/11/2016 5:12:56 PM       Radiology Dg Chest 2 View  Result Date: 07/11/2016 CLINICAL DATA:  Right-sided upper chest pain today with dizziness and weakness. EXAM: CHEST  2 VIEW COMPARISON:  None. FINDINGS: The heart size and mediastinal contours are within normal limits. Both lungs are clear. The visualized skeletal structures are unremarkable. IMPRESSION: No active cardiopulmonary disease. Electronically Signed   By: Elberta Fortis M.D.   On: 07/11/2016 18:32    Procedures Procedures (including critical care time)  Medications Ordered in ED Medications  gi cocktail (Maalox,Lidocaine,Donnatal) (30 mLs Oral Given 07/11/16 1753)  acetaminophen (TYLENOL) tablet 1,000 mg (1,000 mg Oral Given 07/11/16 1752)  ibuprofen (ADVIL,MOTRIN) tablet 800 mg (800 mg Oral Given 07/11/16 1752)     Initial Impression /  Assessment and Plan / ED Course  I have reviewed the triage vital signs and the nursing notes.  Pertinent labs & imaging results that were available during my care of the patient were reviewed by me and considered in my medical decision making (see chart for details).  Clinical Course    50 yo F With multiple complaints. Patient appears to have right-sided carpal tunnel syndrome. She also appears to have right-sided Achilles tendinitis. Chest pain is completely atypical of cardiac chest pain. I suspect this is likely reflux in nature based on worse with eating and lying flat. She also has palpable tenderness in the epigastrium. I will give her a GI cocktail. obtain labs to rule out pancreatitis. Placed in a right removable wrist splint. Trial of NSAIDs. PCP follow-up.    I have discussed the diagnosis/risks/treatment options with the patient and family and believe the pt to be eligible for discharge home to follow-up with PCP. We also discussed returning to the ED immediately if new or worsening sx occur. We discussed the sx which are most concerning (e.g., sudden worsening pain, fever, inability to  tolerate by mouth) that necessitate immediate return. Medications administered to the patient during their visit and any new prescriptions provided to the patient are listed below.  Medications given during this visit Medications  gi cocktail (Maalox,Lidocaine,Donnatal) (30 mLs Oral Given 07/11/16 1753)  acetaminophen (TYLENOL) tablet 1,000 mg (1,000 mg Oral Given 07/11/16 1752)  ibuprofen (ADVIL,MOTRIN) tablet 800 mg (800 mg Oral Given 07/11/16 1752)     The patient appears reasonably screen and/or stabilized for discharge and I doubt any other medical condition or other [facility] requiring further screening, evaluation, or treatment in the ED at this time prior to discharge.    Final Clinical Impressions(s) / ED Diagnoses   Final diagnoses:  Nonspecific chest pain  Carpal tunnel syndrome of right wrist    Achilles tendinitis of right lower extremity    New Prescriptions There are no discharge medications for this patient.    Melene Plan, DO 07/11/16 2307

## 2016-07-11 NOTE — Discharge Instructions (Signed)
Take 4 over the counter ibuprofen tablets 3 times a day or 2 over-the-counter naproxen tablets twice a day for pain. °Also take tylenol 1000mg(2 extra strength) four times a day.  ° °Try zantac 150mg twice a day.  ° ° °

## 2016-07-11 NOTE — ED Triage Notes (Signed)
States she has pain in left foot behind ankle bone that runs up her leg and to heart, feels like electrical current and it makes her weak. Onset 3 months ago. Also c/o dizziness.

## 2016-08-18 ENCOUNTER — Emergency Department (HOSPITAL_BASED_OUTPATIENT_CLINIC_OR_DEPARTMENT_OTHER): Payer: Self-pay

## 2016-08-18 ENCOUNTER — Encounter (HOSPITAL_BASED_OUTPATIENT_CLINIC_OR_DEPARTMENT_OTHER): Payer: Self-pay | Admitting: *Deleted

## 2016-08-18 ENCOUNTER — Emergency Department (HOSPITAL_BASED_OUTPATIENT_CLINIC_OR_DEPARTMENT_OTHER)
Admission: EM | Admit: 2016-08-18 | Discharge: 2016-08-18 | Disposition: A | Discharge status: Home / Self Care | Payer: Self-pay | Attending: Emergency Medicine | Admitting: Emergency Medicine

## 2016-08-18 DIAGNOSIS — R42 Dizziness and giddiness: Secondary | ICD-10-CM | POA: Insufficient documentation

## 2016-08-18 DIAGNOSIS — R0602 Shortness of breath: Secondary | ICD-10-CM | POA: Insufficient documentation

## 2016-08-18 DIAGNOSIS — R5383 Other fatigue: Secondary | ICD-10-CM

## 2016-08-18 DIAGNOSIS — R110 Nausea: Secondary | ICD-10-CM | POA: Insufficient documentation

## 2016-08-18 LAB — COMPREHENSIVE METABOLIC PANEL
ALT: 11 U/L — ABNORMAL LOW (ref 14–54)
ANION GAP: 6 (ref 5–15)
AST: 20 U/L (ref 15–41)
Albumin: 3.6 g/dL (ref 3.5–5.0)
Alkaline Phosphatase: 73 U/L (ref 38–126)
BILIRUBIN TOTAL: 0.6 mg/dL (ref 0.3–1.2)
BUN: 17 mg/dL (ref 6–20)
CO2: 26 mmol/L (ref 22–32)
Calcium: 9.2 mg/dL (ref 8.9–10.3)
Chloride: 108 mmol/L (ref 101–111)
Creatinine, Ser: 1 mg/dL (ref 0.44–1.00)
GFR calc Af Amer: >60 mL/min (ref >60)
Glucose, Bld: 101 mg/dL — ABNORMAL HIGH (ref 65–99)
POTASSIUM: 3.9 mmol/L (ref 3.5–5.1)
Sodium: 140 mmol/L (ref 135–145)
Total Protein: 7.6 g/dL (ref 6.5–8.1)

## 2016-08-18 LAB — CBC WITH DIFFERENTIAL/PLATELET
Basophils Absolute: 0 10*3/uL (ref 0.0–0.1)
Basophils Relative: 0 %
Eosinophils Absolute: 0.3 10*3/uL (ref 0.0–0.7)
Eosinophils Relative: 5 %
HEMATOCRIT: 37.6 % (ref 36.0–46.0)
Hemoglobin: 12.9 g/dL (ref 12.0–15.0)
LYMPHS PCT: 31 %
Lymphs Abs: 1.8 10*3/uL (ref 0.7–4.0)
MCH: 29.4 pg (ref 26.0–34.0)
MCHC: 34.3 g/dL (ref 30.0–36.0)
MCV: 85.6 fL (ref 78.0–100.0)
MONO ABS: 0.7 10*3/uL (ref 0.1–1.0)
MONOS PCT: 12 %
Neutro Abs: 3 10*3/uL (ref 1.7–7.7)
Neutrophils Relative %: 52 %
Platelets: 317 10*3/uL (ref 150–400)
RBC: 4.39 MIL/uL (ref 3.87–5.11)
RDW: 13.6 % (ref 11.5–15.5)
WBC: 5.8 10*3/uL (ref 4.0–10.5)

## 2016-08-18 NOTE — ED Triage Notes (Signed)
C/o dizziness, nausea,  and feels faint x 2 in 1 week. No h/a. On arrival pt states she just feels weak. No pain.

## 2016-08-18 NOTE — ED Notes (Signed)
PA at bedside at this time.  

## 2016-08-18 NOTE — ED Notes (Addendum)
Went into room to obtain orthostatics and EKG, CT Tech was leaving with the pt to perform the CT scan. Husband informed on the wait.

## 2016-08-18 NOTE — ED Provider Notes (Signed)
[facility] Provider Note   CSN: [id number] Arrival date & time: 08/18/16  1739  By signing my name below, I, [name], attest that this documentation has been prepared under the direction and in the presence of [name], NP-C. Electronically Signed: [name], ED Scribe. 08/18/16. 6:41 PM.  History   Chief Complaint Chief Complaint  Patient presents with  . Fatigue    The history is provided by the patient. No language interpreter was used.  Dizziness  Severity:  Moderate Onset quality:  Gradual Duration:  3 days Timing:  Intermittent Progression:  Worsening Chronicity:  New Worsened by:  Turning head Ineffective treatments:  None tried Associated symptoms: nausea and shortness of breath   Associated symptoms: no chest pain and no vomiting   Risk factors: no hx of vertigo and no multiple medications   HPI Comments: Rhonda Fernandez is a 50 y.o. female who presents to the Emergency Department complaining of sudden onset, intermittent dizziness onset 3 days ago. Pt reports associated generalized fatigue, SOB when she is experiencing dizziness, lightheadedness, and nausea. She reports worsening dizziness with turning her head side to side. Pt reports a hx of similar symptoms a few years ago where she experienced syncope at a bus stop. She was evaluated, but no etiology was found. She has not tried anything for her current symptoms. She denies fever, chills, chest pain, or vomiting.   History reviewed. No pertinent past medical history.  There are no active problems to display for this patient.   History reviewed. No pertinent surgical history.  OB History    No data available       Home Medications    Prior to Admission medications   Not on File    Family History No family history on file.  Social History Social History  Substance Use Topics  . Smoking status: Never Smoker  . Smokeless tobacco: Never Used  . Alcohol use No     Allergies     Chloroquine   Review of Systems Review of Systems  Constitutional: Positive for fatigue. Negative for chills and fever.  Respiratory: Positive for shortness of breath.   Cardiovascular: Negative for chest pain.  Gastrointestinal: Positive for nausea. Negative for vomiting.  Neurological: Positive for dizziness and light-headedness.  All other systems reviewed and are negative.    Physical Exam Updated Vital Signs BP 134/80 (BP Location: Left Arm)   Pulse 60   Temp 98.8 F (37.1 C) (Oral)   Resp 18   Ht 5\' 6"  (1.676 m)   Wt 170 lb (77.1 kg)   SpO2 100%   BMI 27.44 kg/m   Physical Exam  Constitutional: She is oriented to person, place, and time. She appears well-developed and well-nourished.  HENT:  Head: Normocephalic and atraumatic.  Eyes: EOM are normal. Pupils are equal, round, and reactive to light.  Neck: Normal range of motion. Neck supple.  Cardiovascular: Normal rate, regular rhythm and normal heart sounds.   Pulmonary/Chest: Effort normal and breath sounds normal.  Abdominal: Soft. There is no tenderness.  Musculoskeletal: Normal range of motion.  Neurological: She is alert and oriented to person, place, and time. She has normal strength. No cranial nerve deficit or sensory deficit. Coordination normal.  Skin: Skin is warm and dry.  Psychiatric: She has a normal mood and affect. Her behavior is normal.  Nursing note and vitals reviewed.    ED Treatments / Results  DIAGNOSTIC STUDIES: Oxygen Saturation is 100% on RA, normal  by  my interpretation.    COORDINATION OF CARE: 6:39 PM-Discussed treatment plan which includes labs and CT scan with pt at bedside and pt agreed to plan.    Labs (all labs ordered are listed, but only abnormal results are displayed) Labs Reviewed  COMPREHENSIVE METABOLIC PANEL - Abnormal; Notable for the following:       Result Value   Glucose, Bld 101 (*)    ALT 11 (*)    All other components within normal limits  CBC WITH  DIFFERENTIAL/PLATELET    EKG  EKG Interpretation  Date/Time:  Friday August 18 2016 19:19:13 EDT Ventricular Rate:  51 PR Interval:    QRS Duration: 90 QT Interval:  442 QTC Calculation: 408 R Axis:   32 Text Interpretation:  Sinus rhythm Low voltage, precordial leads Abnormal R-wave progression, early transition since last tracing no significant change Confirmed by BELFI  MD, MELANIE ([id number]) on 08/18/2016 7:31:12 PM       Radiology Ct Head Wo Contrast  Result Date: 08/18/2016 CLINICAL DATA:  50 y/o  F; dizziness. EXAM: CT HEAD WITHOUT CONTRAST TECHNIQUE: Contiguous axial images were obtained from the base of the skull through the vertex without intravenous contrast. COMPARISON:  03/27/2011 CT of the head. FINDINGS: Brain: No evidence of acute infarction, hemorrhage, hydrocephalus, extra-axial collection or mass lesion/mass effect. Vascular: No hyperdense vessel or unexpected calcification. Skull: Normal. Negative for fracture or focal lesion. Sinuses/Orbits: No acute finding. Other: None. IMPRESSION: No acute intracranial abnormality is identified. Normal CT of the head. Electronically Signed   By: Mitzi HansenLance  Furusawa-Stratton M.D.   On: 08/18/2016 19:25    Procedures Procedures (including critical care time)  Medications Ordered in ED Medications - No data to display   Initial Impression / Assessment and Plan / ED Course  I have reviewed the triage vital signs and the nursing notes.  Pertinent labs & imaging results that were available during my care of the patient were reviewed by me and considered in my medical decision making (see chart for details).  Clinical Course  Lab, radiology, ECG results reviewed and shared with patient, are reassuring.   Care instructions provided. Return precautions discussed. Patient [facility].    Final Clinical Impressions(s) / ED Diagnoses  Fatigue. Near syncope.  Final diagnoses:  None  I  personally performed the services described in this documentation, which was scribed in my presence. The recorded information has been reviewed and is accurate.    New Prescriptions New Prescriptions   No medications on file     Felicie Mornavid Denisia Harpole, NP 08/19/16 0020    Rolan BuccoMelanie Belfi, MD 08/20/16 [phone]

## 2017-01-07 ENCOUNTER — Inpatient Hospital Stay (HOSPITAL_COMMUNITY)
Admission: AD | Admit: 2017-01-07 | Discharge: 2017-01-07 | Disposition: A | Discharge status: Home / Self Care | Payer: Self-pay | Source: Ambulatory Visit | Attending: Obstetrics & Gynecology | Admitting: Obstetrics & Gynecology

## 2017-01-07 ENCOUNTER — Encounter (HOSPITAL_COMMUNITY): Payer: Self-pay

## 2017-01-07 DIAGNOSIS — Z888 Allergy status to other drugs, medicaments and biological substances status: Secondary | ICD-10-CM | POA: Insufficient documentation

## 2017-01-07 DIAGNOSIS — B3731 Acute candidiasis of vulva and vagina: Secondary | ICD-10-CM

## 2017-01-07 DIAGNOSIS — B373 Candidiasis of vulva and vagina: Secondary | ICD-10-CM | POA: Insufficient documentation

## 2017-01-07 DIAGNOSIS — L299 Pruritus, unspecified: Secondary | ICD-10-CM | POA: Insufficient documentation

## 2017-01-07 LAB — URINALYSIS, ROUTINE W REFLEX MICROSCOPIC
BILIRUBIN URINE: NEGATIVE
Glucose, UA: NEGATIVE mg/dL
Hgb urine dipstick: NEGATIVE
KETONES UR: NEGATIVE mg/dL
Nitrite: NEGATIVE
PROTEIN: NEGATIVE mg/dL
Specific Gravity, Urine: 1.012 (ref 1.005–1.030)
pH: 5 (ref 5.0–8.0)

## 2017-01-07 LAB — WET PREP, GENITAL
Clue Cells Wet Prep HPF POC: NONE SEEN
Sperm: NONE SEEN
TRICH WET PREP: NONE SEEN

## 2017-01-07 MED ORDER — FLUCONAZOLE 150 MG PO TABS: 150.0000 mg | ORAL_TABLET | Freq: Every day | ORAL | 0 refills | Status: DC

## 2017-01-07 NOTE — MAU Note (Signed)
Has this itching for over a month.  Started at her breasts, then went to abd, now is down to her vagina.  Can't really scratch at the breast, because it hurts at the same time.  Burning in her vagina, woke up with this today

## 2017-01-07 NOTE — Discharge Instructions (Signed)
Get over the counter monistat 7 day cream and use vaginally - DO NOT USE VAGISIL [facility] get [facility] for health [facility].

## 2017-01-07 NOTE — MAU Provider Note (Signed)
History     CSN: [id number]  Arrival date and time: 01/07/17 1409   First Provider Initiated Contact with Patient 01/07/17 1445      Chief Complaint  Patient presents with  . Vaginal Itching  . Pruritis   HPI [employer] Leaver 51 y.o.  Comes to MAU today with periodic itching in her breasts bilaterally and then the itching goes down her torso.  Today had itching all the way into her vagina.  [place]  [place] and does not currently have insurance.  No menses in past year - menopausal.   OB History    Gravida Para Term Preterm AB Living   3 3 3     3    SAB TAB Ectopic Multiple Live Births           3      No past medical history on file.  No past surgical history on file.  No family history on file.  Social History  Substance Use Topics  . Smoking status: Never Smoker  . Smokeless tobacco: Never Used  . Alcohol use No    Allergies:  Allergies  Allergen Reactions  . Chloroquine Itching    Terrible feeling    No prescriptions prior to admission.    Review of Systems  Constitutional: Negative for fever.  Gastrointestinal: Negative for nausea and vomiting.  Genitourinary: Negative for dysuria and vaginal discharge.       Vaginal itching No menses in past year  Skin:       Bruise on outside of right arm above the elbow and above ankle on outer side of right foot. Itching of breasts bilaterally and pain deep in breasts    Physical Exam   Blood pressure 128/85, pulse 64, temperature 98.2 F (36.8 C), temperature source Oral, resp. rate 16, weight 194 lb 3.2 oz (88.1 kg).  Physical Exam  Nursing note and vitals reviewed. Constitutional: She is oriented to person, place, and time. She appears well-developed and well-nourished.  HENT:  Head: Normocephalic.  Eyes: EOM are normal.  Neck: Neck supple.  GI: Soft. There is no tenderness.  Genitourinary:  Genitourinary Comments: Breast exam  - no masses, no change in  skin, no nipple discharge, no itching currently in breasts  Speculum exam: Vagina - Bright red at entire entroitus, minimal white discharge, vaginal walls very smooth Cervix - No contact bleeding Bimanual exam: Cervix closed Uterus non tender, unable to size due to habitus Adnexa non tender, exam limited due to habitus GC/Chlam, wet prep done Chaperone present for exam.   Musculoskeletal: Normal range of motion.  Neurological: She is alert and oriented to person, place, and time.  Skin: Skin is warm and dry.   On the outer side of her right arm, there is a 2 cm area, slightly darker than skin, tender to palpation, some thickening under the skin - characteristics consistent with a bruise - client denies any injury to the area.  Also no changes noted in area on the outside and above the right ankle.  No area of concern seen.  No tenderness to palpation.   Psychiatric: She has a normal mood and affect.    MAU Course  Procedures  MDM Results for orders placed or performed during the hospital encounter of 01/07/17 (from the past 24 hour(s))  Urinalysis, Routine w reflex microscopic     Status: Abnormal   Collection Time: 01/07/17  2:35 PM  Result Value Ref Range  Color, Urine STRAW (A) YELLOW   APPearance CLEAR CLEAR   Specific Gravity, Urine 1.012 1.005 - 1.030   pH 5.0 5.0 - 8.0   Glucose, UA NEGATIVE NEGATIVE mg/dL   Hgb urine dipstick NEGATIVE NEGATIVE   Bilirubin Urine NEGATIVE NEGATIVE   Ketones, ur NEGATIVE NEGATIVE mg/dL   Protein, ur NEGATIVE NEGATIVE mg/dL   Nitrite NEGATIVE NEGATIVE   Leukocytes, UA TRACE (A) NEGATIVE   RBC / HPF 0-5 0 - 5 RBC/hpf   WBC, UA 0-5 0 - 5 WBC/hpf   Bacteria, UA RARE (A) NONE SEEN   Squamous Epithelial / LPF 0-5 (A) NONE SEEN   Mucous PRESENT   Wet prep, genital     Status: Abnormal   Collection Time: 01/07/17  3:04 PM  Result Value Ref Range   Yeast Wet Prep HPF POC PRESENT (A) NONE SEEN   Trich, Wet Prep NONE SEEN NONE SEEN   Clue  Cells Wet Prep HPF POC NONE SEEN NONE SEEN   WBC, Wet Prep HPF POC MODERATE (A) NONE SEEN   Sperm NONE SEEN      Assessment and Plan  Vaginal yeast infection  Plan Use over the counter 7 day monistat cream vaginally Will eprescribe diflucan x one dose to her pharmacy Be seen at [facility] to get established for health care in [place] You need a mammogram and likely a pap smear.   Terri L Burleson 01/07/2017, 3:13 PM

## 2017-01-07 NOTE — Progress Notes (Signed)
Patient denies any changes in soaps, detergents, using fragrance free products, states the itching is a burning pain.  They have tried many creams from the pharmacy as well as epson salts.

## 2017-01-08 LAB — RPR: RPR: NONREACTIVE

## 2017-01-08 LAB — HIV ANTIBODY (ROUTINE TESTING W REFLEX): HIV SCREEN 4TH GENERATION: NONREACTIVE

## 2017-01-09 LAB — GC/CHLAMYDIA PROBE AMP (~~LOC~~) NOT AT ARMC
Chlamydia: NEGATIVE
NEISSERIA GONORRHEA: NEGATIVE

## 2018-02-06 ENCOUNTER — Emergency Department (HOSPITAL_COMMUNITY): Payer: Self-pay

## 2018-02-06 ENCOUNTER — Emergency Department (HOSPITAL_COMMUNITY)
Admission: EM | Admit: 2018-02-06 | Discharge: 2018-02-06 | Disposition: A | Discharge status: Home / Self Care | Payer: Self-pay | Attending: Emergency Medicine | Admitting: Emergency Medicine

## 2018-02-06 ENCOUNTER — Encounter (HOSPITAL_COMMUNITY): Payer: Self-pay | Admitting: Emergency Medicine

## 2018-02-06 DIAGNOSIS — R42 Dizziness and giddiness: Secondary | ICD-10-CM | POA: Insufficient documentation

## 2018-02-06 LAB — COMPREHENSIVE METABOLIC PANEL
ALK PHOS: 96 U/L (ref 38–126)
ALT: 20 U/L (ref 14–54)
AST: 32 U/L (ref 15–41)
Albumin: 3.7 g/dL (ref 3.5–5.0)
Anion gap: 9 (ref 5–15)
BILIRUBIN TOTAL: 0.8 mg/dL (ref 0.3–1.2)
BUN: 13 mg/dL (ref 6–20)
CALCIUM: 9.2 mg/dL (ref 8.9–10.3)
CO2: 26 mmol/L (ref 22–32)
CREATININE: 0.85 mg/dL (ref 0.44–1.00)
Chloride: 105 mmol/L (ref 101–111)
Glucose, Bld: 128 mg/dL — ABNORMAL HIGH (ref 65–99)
Potassium: 4 mmol/L (ref 3.5–5.1)
Sodium: 140 mmol/L (ref 135–145)
Total Protein: 8.3 g/dL — ABNORMAL HIGH (ref 6.5–8.1)

## 2018-02-06 LAB — CBC WITH DIFFERENTIAL/PLATELET
Basophils Absolute: 0 10*3/uL (ref 0.0–0.1)
Basophils Relative: 0 %
Eosinophils Absolute: 0.1 10*3/uL (ref 0.0–0.7)
Eosinophils Relative: 1 %
HCT: 41.5 % (ref 36.0–46.0)
HEMOGLOBIN: 14.2 g/dL (ref 12.0–15.0)
LYMPHS ABS: 1.5 10*3/uL (ref 0.7–4.0)
LYMPHS PCT: 20 %
MCH: 29.9 pg (ref 26.0–34.0)
MCHC: 34.2 g/dL (ref 30.0–36.0)
MCV: 87.4 fL (ref 78.0–100.0)
Monocytes Absolute: 0.4 10*3/uL (ref 0.1–1.0)
Monocytes Relative: 5 %
NEUTROS PCT: 74 %
Neutro Abs: 5.8 10*3/uL (ref 1.7–7.7)
Platelets: 374 10*3/uL (ref 150–400)
RBC: 4.75 MIL/uL (ref 3.87–5.11)
RDW: 13.9 % (ref 11.5–15.5)
WBC: 7.7 10*3/uL (ref 4.0–10.5)

## 2018-02-06 LAB — I-STAT TROPONIN, ED: TROPONIN I, POC: 0 ng/mL (ref 0.00–0.08)

## 2018-02-06 MED ORDER — ONDANSETRON HCL 4 MG/2ML IJ SOLN
4.0000 mg | Freq: Once | INTRAMUSCULAR | Status: AC
  Administered 2018-02-06: 4 mg via INTRAVENOUS
  Filled 2018-02-06: qty 2

## 2018-02-06 MED ORDER — ONDANSETRON 4 MG PO TBDP: 4.0000 mg | ORAL_TABLET | Freq: Three times a day (TID) | ORAL | 0 refills | Status: AC | PRN

## 2018-02-06 MED ORDER — SODIUM CHLORIDE 0.9 % IV BOLUS (SEPSIS)
1000.0000 mL | Freq: Once | INTRAVENOUS | Status: AC
  Administered 2018-02-06: 1000 mL via INTRAVENOUS

## 2018-02-06 MED ORDER — MECLIZINE HCL 25 MG PO TABS
50.0000 mg | ORAL_TABLET | Freq: Once | ORAL | Status: AC
  Administered 2018-02-06: 50 mg via ORAL
  Filled 2018-02-06: qty 2

## 2018-02-06 MED ORDER — MECLIZINE HCL 25 MG PO TABS: 25.0000 mg | ORAL_TABLET | Freq: Three times a day (TID) | ORAL | 0 refills | Status: AC | PRN

## 2018-02-06 NOTE — ED Triage Notes (Signed)
Per [facility] pt coming from home for Cough x 2 weeks and dizziness that started around 1pm today. CBG 123

## 2018-02-06 NOTE — ED Provider Notes (Signed)
[facility]-EMERGENCY DEPT Provider Note   CSN: [id number] Arrival date & time: 02/06/18  1445     History   Chief Complaint No chief complaint on file.   HPI Rhonda Fernandez is a 52 y.o. female.  HPI   52 year old female with no significant medical history presents with concern for dizziness.  Describes the dizziness both as a sensation of room spinning and lightheadedness.  Reports that it is a severe room spinning sensation, which is improved with laying down with eyes closed, and is worsened with head movements, eye movements or other changes in position.  Reports associated severe nausea, but denies vomiting.  Reports she had one prior episode of similar symptoms approximately a year ago, was not sure what the diagnosis was.  Denies numbness, focal weakness, visual changes, slurred speech, difficulty finding words, or other focal neurologic problems.  Reports that due to the dizziness, she feels severe generalized weakness which is made it difficult for her to walk.  Reports she has had a cough for the last 2 weeks, and had some dyspnea today in association with the nausea and dizziness.  Denies chest pain, denies fevers, diarrhea, black or bloody stool.  Denies having nasal congestion, ear pain, or tinnitus.  Reports she has pain in her right knee, but otherwise denies any other leg pain or swelling, history of DVT or PE, recent travel or surgeries.  She denies a family history of coronary artery disease, family history of stroke, or personal history of either, and also denies history of hypertension, hyperlipidemia or diabetes.  She denies history of smoking, etoh or other drugs.   History reviewed. No pertinent past medical history.  There are no active problems to display for this patient.   History reviewed. No pertinent surgical history.  OB History    Gravida Para Term Preterm AB Living   3 3 3     3    SAB TAB Ectopic Multiple Live Births           3        Home Medications    Prior to Admission medications   Medication Sig Start Date End Date Taking? Authorizing Provider  mineral oil enema Place 1 enema rectally once.   Yes [provider]  Misc Natural Products (PRO HERBS LEG VEIN/CIRCULATION PO) Take 2 tablets by mouth once.   Yes [provider]  meclizine (ANTIVERT) 25 MG tablet Take 1 tablet (25 mg total) by mouth 3 (three) times daily as needed for dizziness. 02/06/18   Alvira Monday, MD  ondansetron (ZOFRAN ODT) 4 MG disintegrating tablet Take 1 tablet (4 mg total) by mouth every 8 (eight) hours as needed for nausea or vomiting. 02/06/18   Alvira Monday, MD    Family History No family history on file.  Social History Social History   Tobacco Use  . Smoking status: Never Smoker  . Smokeless tobacco: Never Used  Substance Use Topics  . Alcohol use: No  . Drug use: No     Allergies   Chloroquine   Review of Systems Review of Systems  Constitutional: Positive for fatigue. Negative for fever.  HENT: Negative for sore throat.   Eyes: Negative for visual disturbance.  Respiratory: Positive for cough and shortness of breath.   Cardiovascular: Negative for chest pain.  Gastrointestinal: Positive for nausea. Negative for abdominal pain, constipation, diarrhea and vomiting.  Genitourinary: Negative for difficulty urinating.  Musculoskeletal: Negative for back pain.  Skin: Negative for rash.  Neurological: Positive for dizziness and light-headedness. Negative for syncope, speech difficulty, weakness (reports generalized but denies focal), numbness and headaches.     Physical Exam Updated Vital Signs BP 111/70 (BP Location: Left Arm)   Pulse (!) 54   Temp (!) 97.5 F (36.4 C) (Oral)   Resp 15   SpO2 100%   Physical Exam  Constitutional: She is oriented to person, place, and time. She appears well-developed and well-nourished. No distress.  HENT:  Head: Normocephalic and atraumatic.  Eyes:  Conjunctivae and EOM are normal. Pupils are equal, round, and reactive to light.  Neck: Normal range of motion.  Cardiovascular: Normal rate, regular rhythm, normal heart sounds and intact distal pulses. Exam reveals no gallop and no friction rub.  No murmur heard. Pulmonary/Chest: Effort normal and breath sounds normal. No respiratory distress. She has no wheezes. She has no rales.  Abdominal: Soft. She exhibits no distension. There is no tenderness. There is no guarding.  Musculoskeletal: She exhibits no edema or tenderness.  Neurological: She is alert and oriented to person, place, and time. She has normal strength. No cranial nerve deficit or sensory deficit. Coordination normal. Abnormal gait: deferred on initial eval. GCS eye subscore is 4. GCS verbal subscore is 5. GCS motor subscore is 6.  Skin: Skin is warm and dry. No rash noted. She is not diaphoretic. No erythema.  Nursing note and vitals reviewed.    ED Treatments / Results  Labs (all labs ordered are listed, but only abnormal results are displayed) Labs Reviewed  COMPREHENSIVE METABOLIC PANEL - Abnormal; Notable for the following components:      Result Value   Glucose, Bld 128 (*)    Total Protein 8.3 (*)    All other components within normal limits  CBC WITH DIFFERENTIAL/PLATELET  I-STAT TROPONIN, ED    EKG  EKG Interpretation  Date/Time:  Wednesday February 06 2018 15:32:28 EST Ventricular Rate:  56 PR Interval:    QRS Duration: 86 QT Interval:  434 QTC Calculation: 419 R Axis:   45 Text Interpretation:  Sinus rhythm Consider left atrial enlargement No significant change since last tracing Confirmed by Alvira MondaySchlossman, Kavin Weckwerth ([id number]) on 02/06/2018 3:53:46 PM       Radiology Dg Chest 2 View  Result Date: 02/06/2018 CLINICAL DATA:  Cough, shortness of breath and nausea for 2 weeks. EXAM: CHEST  2 VIEW COMPARISON:  PA and lateral chest 07/11/2016. FINDINGS: Lungs are clear. Heart size is normal. No pneumothorax or  pleural fluid. No bony abnormality. IMPRESSION: Negative chest. Electronically Signed   By: Drusilla Kannerhomas  Dalessio M.D.   On: 02/06/2018 15:49    Procedures Procedures (including critical care time)  Medications Ordered in ED Medications  sodium chloride 0.9 % bolus 1,000 mL (0 mLs Intravenous Stopped 02/06/18 1811)  ondansetron (ZOFRAN) injection 4 mg (4 mg Intravenous Given 02/06/18 1619)  meclizine (ANTIVERT) tablet 50 mg (50 mg Oral Given 02/06/18 1617)     Initial Impression / Assessment and Plan / ED Course  I have reviewed the triage vital signs and the nursing notes.  Pertinent labs & imaging results that were available during my care of the patient were reviewed by me and considered in my medical decision making (see chart for details).      52 year old female with no significant medical history presents with concern for dizziness.  Describes the dizziness both as a sensation of room spinning and lightheadedness.  EKG without significant findings, vital signs WNL.  CBC shows hemoglobin wnl. Electrolytes  wnl.  Doubt PE given no tachycardia, no sign of DVT, no hypoxia, no tachypnea, and suspect the sensation of dyspnea she describes on my ROS may be secondary to her nausea. CXR shows no pneumonia or CHF.  Suspect by history most likely vertigo, and suspect peripheral given severity, positional nature, no other neuro symptoms on history or exam, no CVA risk factors.   Given IV fluids, zofran, meclizine.  Patient reports significant improvement following medications. Is sitting up, looking around.  Was able to walk independently to the bathroom without gait abnormalities.  Suspect likely viral etiology of peripheral vertigo and viral URI causing cough. Given rx for meclizine and zofran. Patient discharged in stable condition with understanding of reasons to return.   Final Clinical Impressions(s) / ED Diagnoses   Final diagnoses:  Vertigo    ED Discharge Orders        Ordered     meclizine (ANTIVERT) 25 MG tablet  3 times daily PRN     02/06/18 1815    ondansetron (ZOFRAN ODT) 4 MG disintegrating tablet  Every 8 hours PRN     02/06/18 1815       Alvira Monday, MD 02/07/18 1610

## 2018-02-06 NOTE — ED Notes (Signed)
Patient transported to X-ray 

## 2018-05-11 ENCOUNTER — Other Ambulatory Visit: Payer: Self-pay

## 2018-05-11 ENCOUNTER — Emergency Department (HOSPITAL_BASED_OUTPATIENT_CLINIC_OR_DEPARTMENT_OTHER)
Admission: EM | Admit: 2018-05-11 | Discharge: 2018-05-11 | Disposition: A | Discharge status: Home / Self Care | Payer: Self-pay | Attending: Emergency Medicine | Admitting: Emergency Medicine

## 2018-05-11 ENCOUNTER — Emergency Department (HOSPITAL_BASED_OUTPATIENT_CLINIC_OR_DEPARTMENT_OTHER): Payer: Self-pay

## 2018-05-11 ENCOUNTER — Encounter (HOSPITAL_BASED_OUTPATIENT_CLINIC_OR_DEPARTMENT_OTHER): Payer: Self-pay | Admitting: Emergency Medicine

## 2018-05-11 DIAGNOSIS — M25462 Effusion, left knee: Secondary | ICD-10-CM | POA: Insufficient documentation

## 2018-05-11 DIAGNOSIS — Z79899 Other long term (current) drug therapy: Secondary | ICD-10-CM | POA: Insufficient documentation

## 2018-05-11 DIAGNOSIS — M25562 Pain in left knee: Secondary | ICD-10-CM

## 2018-05-11 MED ORDER — NAPROXEN 500 MG PO TABS: 500.0000 mg | ORAL_TABLET | Freq: Two times a day (BID) | ORAL | 0 refills | Status: AC

## 2018-05-11 NOTE — ED Triage Notes (Signed)
Patient states that she has pain to her left knee x 2 months. She reports that she started to have swelling to the area for about 3 days ago

## 2018-05-11 NOTE — ED Provider Notes (Signed)
[facility] EMERGENCY DEPARTMENT Provider Note   CSN: [id number] Arrival date & time: 05/11/18  1517     History   Chief Complaint Chief Complaint  Patient presents with  . Knee Pain    HPI Rhonda Fernandez is a 52 y.o. female who presents to ED for evaluation of 3897-month history of left knee pain.  Cannot recall any inciting event that may have triggered the symptoms.  She describes the pain as aching and worse with ambulation.  She has not taken any medications to help with her symptoms.  States that she noticed some swelling to her left knee for the past week.  Denies any injuries or falls, numbness in legs, prior DVT or PE, recent surgeries, recent prolonged travel, fevers, history of gout or septic joint.  HPI  History reviewed. No pertinent past medical history.  There are no active problems to display for this patient.   History reviewed. No pertinent surgical history.   OB History    Gravida  3   Para  3   Term  3   Preterm      AB      Living  3     SAB      TAB      Ectopic      Multiple      Live Births  3            Home Medications    Prior to Admission medications   Medication Sig Start Date End Date Taking? Authorizing Provider  meclizine (ANTIVERT) 25 MG tablet Take 1 tablet (25 mg total) by mouth 3 (three) times daily as needed for dizziness. 02/06/18   Alvira MondaySchlossman, Erin, MD  mineral oil enema Place 1 enema rectally once.    [provider]  Misc Natural Products (PRO HERBS LEG VEIN/CIRCULATION PO) Take 2 tablets by mouth once.    [provider]  naproxen (NAPROSYN) 500 MG tablet Take 1 tablet (500 mg total) by mouth 2 (two) times daily. 05/11/18   Shannie Kontos, PA-C  ondansetron (ZOFRAN ODT) 4 MG disintegrating tablet Take 1 tablet (4 mg total) by mouth every 8 (eight) hours as needed for nausea or vomiting. 02/06/18   Alvira MondaySchlossman, Erin, MD    Family History History reviewed. No pertinent family history.  Social  History Social History   Tobacco Use  . Smoking status: Never Smoker  . Smokeless tobacco: Never Used  Substance Use Topics  . Alcohol use: No  . Drug use: No     Allergies   Chloroquine   Review of Systems Review of Systems  Constitutional: Negative for chills and fever.  Musculoskeletal: Positive for arthralgias and joint swelling. Negative for back pain, gait problem, neck pain and neck stiffness.  Skin: Negative for wound.  Neurological: Negative for weakness and numbness.     Physical Exam Updated Vital Signs BP 122/72 (BP Location: Right Arm)   Pulse (!) 57   Temp 98.4 F (36.9 C) (Oral)   Resp 18   Ht 5\' 6"  (1.676 m)   Wt 79.4 kg (175 lb)   SpO2 100%   BMI 28.25 kg/m   Physical Exam  Constitutional: She appears well-developed and well-nourished. No distress.  HENT:  Head: Normocephalic and atraumatic.  Eyes: Conjunctivae and EOM are normal. No scleral icterus.  Neck: Normal range of motion.  Pulmonary/Chest: Effort normal. No respiratory distress.  Musculoskeletal: Normal range of motion. She exhibits tenderness.  Tenderness to palpation of the medial  aspect of the left patella.  No obvious deformity, overlying erythema, warmth of joint noted.  No lower extremity pitting edema or calf tenderness bilaterally.  Full active and passive range of motion of knee.  Able to perform straight leg raise without difficulty.  2+ DP pulse noted.  Normal sensation noted to lower extremities.  Neurological: She is alert.  Skin: No rash noted. She is not diaphoretic.  Psychiatric: She has a normal mood and affect.  Nursing note and vitals reviewed.    ED Treatments / Results  Labs (all labs ordered are listed, but only abnormal results are displayed) Labs Reviewed - No data to display  EKG None  Radiology Dg Knee Complete 4 Views Left  Result Date: 05/11/2018 CLINICAL DATA:  Knee pain for 1 month with increased swelling today, initial encounter EXAM: LEFT KNEE -  COMPLETE 4+ VIEW COMPARISON:  None. FINDINGS: Mild degenerative changes are noted in the medial joint space. Moderate joint effusion is noted. No acute fracture or dislocation is seen. IMPRESSION: Joint effusion and mild degenerative change. No acute bony abnormality noted. Electronically Signed   By: Alcide Clever M.D.   On: 05/11/2018 16:45    Procedures Procedures (including critical care time)  Medications Ordered in ED Medications - No data to display   Initial Impression / Assessment and Plan / ED Course  I have reviewed the triage vital signs and the nursing notes.  Pertinent labs & imaging results that were available during my care of the patient were reviewed by me and considered in my medical decision making (see chart for details).     Patient presents to ED for evaluation of 40-month history of left knee pain.  Pain is atraumatic.  Denies any changes to gait.  Physical exam there is tenderness to the left knee on the medial side.  No overlying erythema, edema, warmth of joint noted.  Area is neurovascularly intact.  No weakness noted.  X-rays returned as negative.  Doubt infectious or vascular cause of symptoms.  Low risk for DVT.  Will give anti-inflammatories, follow-up with sports medicine for further evaluation and advise rice therapy.  Advised to return to ED for any severe worsening symptoms.  Portions of this note were generated with Scientist, clinical (histocompatibility and immunogenetics). Dictation errors may occur despite best attempts at proofreading.   Final Clinical Impressions(s) / ED Diagnoses   Final diagnoses:  Acute pain of left knee  Effusion of left knee    ED Discharge Orders        Ordered    naproxen (NAPROSYN) 500 MG tablet  2 times daily     05/11/18 1706       Dietrich Pates, PA-C 05/11/18 1711    Rolan Bucco, MD 05/11/18 2255

## 2018-12-19 ENCOUNTER — Encounter (HOSPITAL_BASED_OUTPATIENT_CLINIC_OR_DEPARTMENT_OTHER): Payer: Self-pay | Admitting: *Deleted

## 2018-12-19 ENCOUNTER — Emergency Department (HOSPITAL_BASED_OUTPATIENT_CLINIC_OR_DEPARTMENT_OTHER): Payer: BLUE CROSS/BLUE SHIELD

## 2018-12-19 ENCOUNTER — Emergency Department (HOSPITAL_BASED_OUTPATIENT_CLINIC_OR_DEPARTMENT_OTHER)
Admission: EM | Admit: 2018-12-19 | Discharge: 2018-12-19 | Discharge status: Against Medical Advice | Payer: BLUE CROSS/BLUE SHIELD | Attending: Emergency Medicine | Admitting: Emergency Medicine

## 2018-12-19 ENCOUNTER — Other Ambulatory Visit: Payer: Self-pay

## 2018-12-19 DIAGNOSIS — M25462 Effusion, left knee: Secondary | ICD-10-CM | POA: Insufficient documentation

## 2018-12-19 DIAGNOSIS — Z79899 Other long term (current) drug therapy: Secondary | ICD-10-CM | POA: Insufficient documentation

## 2018-12-19 DIAGNOSIS — M25562 Pain in left knee: Secondary | ICD-10-CM | POA: Diagnosis present

## 2018-12-19 DIAGNOSIS — M1712 Unilateral primary osteoarthritis, left knee: Secondary | ICD-10-CM | POA: Insufficient documentation

## 2018-12-19 MED ORDER — NAPROXEN 375 MG PO TABS: 375.0000 mg | ORAL_TABLET | Freq: Two times a day (BID) | ORAL | 0 refills | Status: AC

## 2018-12-19 NOTE — ED Notes (Signed)
Pt and family witnessed leaving department. Delay explained (Waiting for xrays which just resulted). Advised to stay to complete treatment. Pt declined, states she wants to leave. Advised they can return at any time. Pt ambulated from department without difficulty

## 2018-12-19 NOTE — ED Triage Notes (Signed)
Left knee pain for "a long time". No injury.

## 2018-12-19 NOTE — ED Provider Notes (Signed)
[facility] EMERGENCY DEPARTMENT Provider Note   CSN: [id number] Arrival date & time: 12/19/18  1952     History   Chief Complaint Chief Complaint  Patient presents with  . Knee Pain    HPI Rhonda Fernandez is a 53 y.o. female.  Patient reports ongoing, intermittent left knee pain. She was evaluated for a similar complaint in June of 2019. No known injury. Pain is located along medial aspect of the knee. She states the knee joint feels "tight' on occasion, with difficulty bending the knee.  The history is provided by the patient and medical records. No language interpreter was used.  Knee Pain  Location:  Knee Time since incident:  4 weeks Injury: no   Knee location:  L knee Pain details:    Quality:  Aching   Radiates to:  Does not radiate   Severity:  Moderate   Onset quality:  Unable to specify   Duration:  6 months   Timing:  Intermittent   Progression:  Waxing and waning Chronicity:  Recurrent Dislocation: no   Prior injury to area:  No Worsened by:  Bearing weight Ineffective treatments:  Acetaminophen Associated symptoms: no back pain, no fever and no muscle weakness     History reviewed. No pertinent past medical history.  There are no active problems to display for this patient.   History reviewed. No pertinent surgical history.   OB History    Gravida  3   Para  3   Term  3   Preterm      AB      Living  3     SAB      TAB      Ectopic      Multiple      Live Births  3            Home Medications    Prior to Admission medications   Medication Sig Start Date End Date Taking? Authorizing Provider  meclizine (ANTIVERT) 25 MG tablet Take 1 tablet (25 mg total) by mouth 3 (three) times daily as needed for dizziness. 02/06/18   Alvira Monday, MD  mineral oil enema Place 1 enema rectally once.    [provider]  Misc Natural Products (PRO HERBS LEG VEIN/CIRCULATION PO) Take 2 tablets by mouth once.    [provider]  naproxen (NAPROSYN) 500 MG tablet Take 1 tablet (500 mg total) by mouth 2 (two) times daily. 05/11/18   Khatri, Hina, PA-C  ondansetron (ZOFRAN ODT) 4 MG disintegrating tablet Take 1 tablet (4 mg total) by mouth every 8 (eight) hours as needed for nausea or vomiting. 02/06/18   Alvira Monday, MD    Family History No family history on file.  Social History Social History   Tobacco Use  . Smoking status: Never Smoker  . Smokeless tobacco: Never Used  Substance Use Topics  . Alcohol use: No  . Drug use: No     Allergies   Chloroquine   Review of Systems Review of Systems  Constitutional: Negative for fever.  Musculoskeletal: Negative for back pain.  All other systems reviewed and are negative.    Physical Exam Updated Vital Signs BP 132/75   Pulse (!) 57   Temp 98.2 F (36.8 C) (Oral)   Resp 18   Ht 5\' 6"  (1.676 m)   Wt 79.4 kg   SpO2 100%   BMI 28.25 kg/m   Physical Exam Vitals signs and nursing note reviewed.  Constitutional:      Appearance: Normal appearance.  HENT:     Head: Normocephalic.  Eyes:     Conjunctiva/sclera: Conjunctivae normal.  Cardiovascular:     Rate and Rhythm: Normal rate and regular rhythm.  Pulmonary:     Effort: Pulmonary effort is normal.     Breath sounds: Normal breath sounds.  Musculoskeletal:        General: Tenderness present. No deformity or signs of injury.  Skin:    General: Skin is warm and dry.  Neurological:     Mental Status: She is alert and oriented to person, place, and time.  Psychiatric:        Mood and Affect: Mood normal.      ED Treatments / Results  Labs (all labs ordered are listed, but only abnormal results are displayed) Labs Reviewed - No data to display  EKG None  Radiology Dg Knee Complete 4 Views Left  Result Date: 12/19/2018 CLINICAL DATA:  Left knee pain x1 week without known injury. EXAM: LEFT KNEE - COMPLETE 4+ VIEW COMPARISON:  6 minutes 19 FINDINGS: Osteoarthritis  of the medial femorotibial compartment with joint space narrowing and medial femoral condylar spurring appears similar to prior. Moderate suprapatellar joint effusion is identified currently, increased since previous exam. No joint dislocation nor definite fracture. Slight cortical indentation along the anterior aspect of the lateral femoral condyle on the lateral view is identified. An osteochondral injury accounting for this appearance associated with the joint effusion is not excluded. No significant soft tissue swelling. IMPRESSION: Osteoarthritis of the medial femorotibial compartment with moderate suprapatellar joint effusion. Slight cortical irregularity along the anterior aspect of the lateral femoral condyle seen only on the lateral view. This could represent an age indeterminate osteochondral injury. Electronically Signed   By: Tollie Eth M.D.   On: 12/19/2018 21:54    Procedures Procedures (including critical care time)  Medications Ordered in ED Medications - No data to display   Initial Impression / Assessment and Plan / ED Course  I have reviewed the triage vital signs and the nursing notes.  Pertinent labs & imaging results that were available during my care of the patient were reviewed by me and considered in my medical decision making (see chart for details).     Patient left prior to receiving results of xray and presentation of treatment plan.  Final Clinical Impressions(s) / ED Diagnoses   Final diagnoses:  Osteoarthritis of left knee, unspecified osteoarthritis type  Effusion of left knee    ED Discharge Orders         Ordered    naproxen (NAPROSYN) 375 MG tablet  2 times daily     12/19/18 2226           [name], NP 12/19/18 2228    Benjiman Core, MD 12/20/18 0002

## 2019-01-02 ENCOUNTER — Ambulatory Visit: Payer: BLUE CROSS/BLUE SHIELD | Admitting: Family Medicine

## 2019-02-08 ENCOUNTER — Other Ambulatory Visit: Payer: Self-pay

## 2019-02-08 ENCOUNTER — Emergency Department (HOSPITAL_COMMUNITY)
Admission: EM | Admit: 2019-02-08 | Discharge: 2019-02-08 | Disposition: A | Discharge status: Home / Self Care | Payer: BLUE CROSS/BLUE SHIELD | Attending: Emergency Medicine | Admitting: Emergency Medicine

## 2019-02-08 ENCOUNTER — Emergency Department (HOSPITAL_COMMUNITY): Payer: BLUE CROSS/BLUE SHIELD

## 2019-02-08 ENCOUNTER — Encounter (HOSPITAL_COMMUNITY): Payer: Self-pay

## 2019-02-08 DIAGNOSIS — Z79899 Other long term (current) drug therapy: Secondary | ICD-10-CM | POA: Insufficient documentation

## 2019-02-08 DIAGNOSIS — R55 Syncope and collapse: Secondary | ICD-10-CM | POA: Insufficient documentation

## 2019-02-08 DIAGNOSIS — R079 Chest pain, unspecified: Secondary | ICD-10-CM | POA: Insufficient documentation

## 2019-02-08 LAB — COMPREHENSIVE METABOLIC PANEL
ALT: 13 U/L (ref 0–44)
AST: 24 U/L (ref 15–41)
Albumin: 3.6 g/dL (ref 3.5–5.0)
Alkaline Phosphatase: 86 U/L (ref 38–126)
Anion gap: 11 (ref 5–15)
BILIRUBIN TOTAL: 0.6 mg/dL (ref 0.3–1.2)
BUN: 12 mg/dL (ref 6–20)
CO2: 25 mmol/L (ref 22–32)
CREATININE: 0.87 mg/dL (ref 0.44–1.00)
Calcium: 9.3 mg/dL (ref 8.9–10.3)
Chloride: 105 mmol/L (ref 98–111)
GFR calc Af Amer: >60 mL/min (ref >60)
GFR calc non Af Amer: >60 mL/min (ref >60)
Glucose, Bld: 86 mg/dL (ref 70–99)
Potassium: 4 mmol/L (ref 3.5–5.1)
Sodium: 141 mmol/L (ref 135–145)
Total Protein: 7.8 g/dL (ref 6.5–8.1)

## 2019-02-08 LAB — CBC WITH DIFFERENTIAL/PLATELET
Abs Immature Granulocytes: 0.02 10*3/uL (ref 0.00–0.07)
Basophils Absolute: 0.1 10*3/uL (ref 0.0–0.1)
Basophils Relative: 1 %
EOS ABS: 0.3 10*3/uL (ref 0.0–0.5)
EOS PCT: 4 %
HCT: 41.1 % (ref 36.0–46.0)
Hemoglobin: 13.2 g/dL (ref 12.0–15.0)
Immature Granulocytes: 0 %
Lymphocytes Relative: 37 %
Lymphs Abs: 2.8 10*3/uL (ref 0.7–4.0)
MCH: 28 pg (ref 26.0–34.0)
MCHC: 32.1 g/dL (ref 30.0–36.0)
MCV: 87.1 fL (ref 80.0–100.0)
Monocytes Absolute: 0.7 10*3/uL (ref 0.1–1.0)
Monocytes Relative: 10 %
Neutro Abs: 3.7 10*3/uL (ref 1.7–7.7)
Neutrophils Relative %: 48 %
Platelets: 365 10*3/uL (ref 150–400)
RBC: 4.72 MIL/uL (ref 3.87–5.11)
RDW: 13.8 % (ref 11.5–15.5)
WBC: 7.6 10*3/uL (ref 4.0–10.5)
nRBC: 0 % (ref 0.0–0.2)

## 2019-02-08 LAB — TROPONIN I
Troponin I: <0.03 ng/mL (ref <0.03)
Troponin I: <0.03 ng/mL (ref <0.03)

## 2019-02-08 LAB — D-DIMER, QUANTITATIVE: D-Dimer, Quant: 0.78 ug/mL-FEU — ABNORMAL HIGH (ref 0.00–0.50)

## 2019-02-08 MED ORDER — IOHEXOL 350 MG/ML SOLN
100.0000 mL | Freq: Once | INTRAVENOUS | Status: AC | PRN
  Administered 2019-02-08: 75 mL via INTRAVENOUS

## 2019-02-08 MED ORDER — ALUM & MAG HYDROXIDE-SIMETH 200-200-20 MG/5ML PO SUSP
30.0000 mL | Freq: Once | ORAL | Status: AC
  Administered 2019-02-08: 30 mL via ORAL
  Filled 2019-02-08: qty 30

## 2019-02-08 MED ORDER — SODIUM CHLORIDE 0.9 % IV BOLUS
500.0000 mL | Freq: Once | INTRAVENOUS | Status: AC
Start: 2019-02-08 — End: 2019-02-08
  Administered 2019-02-08: 500 mL via INTRAVENOUS

## 2019-02-08 MED ORDER — LIDOCAINE VISCOUS HCL 2 % MT SOLN
15.0000 mL | Freq: Once | OROMUCOSAL | Status: AC
  Administered 2019-02-08: 15 mL via ORAL
  Filled 2019-02-08: qty 15

## 2019-02-08 NOTE — ED Provider Notes (Signed)
[facility] EMERGENCY DEPARTMENT Provider Note   CSN: [id number] Arrival date & time: 02/08/19  1336    History   Chief Complaint Chief Complaint  Patient presents with  . Loss of Consciousness  . Chest Pain    HPI Rhonda Fernandez is a 53 y.o. female.     The history is provided by the patient and medical records. No language interpreter was used.  Loss of Consciousness  Associated symptoms: chest pain   Chest Pain  Associated symptoms: syncope    Rhonda Fernandez is a 53 y.o. female who presents to the Emergency Department complaining of chest pain, syncope. She presents to the emergency department accompanied by her husband for multiple symptoms. She began feeling poorly several days ago with intermittent left sided chest pain. Pain waxes and wanes with no clear alleviating or worsening factors. Pain does radiate to the left upper extremity and he had she has some intermittent tingling in the left arm. Today about 1215 she began to feel lightheadedness if she might pass out and sat down to the ground and then passed out. It is unclear how long she was unconscious. She also complains of nocturnal headaches for the last several nights. She has experienced diaphoresis with nausea. She has chronic intermittent left knee pain, no distal lower extremity pain or swelling. She has no known medical problems and takes no medications. She is menopausal. No history of DVT/PE. No tobacco, alcohol, drug use. History reviewed. No pertinent past medical history.  There are no active problems to display for this patient.   History reviewed. No pertinent surgical history.   OB History    Gravida  3   Para  3   Term  3   Preterm      AB      Living  3     SAB      TAB      Ectopic      Multiple      Live Births  3            Home Medications    Prior to Admission medications   Medication Sig Start Date End Date Taking? Authorizing Provider  meclizine  (ANTIVERT) 25 MG tablet Take 1 tablet (25 mg total) by mouth 3 (three) times daily as needed for dizziness. 02/06/18   Alvira Monday, MD  mineral oil enema Place 1 enema rectally once.    [provider]  Misc Natural Products (PRO HERBS LEG VEIN/CIRCULATION PO) Take 2 tablets by mouth once.    [provider]  naproxen (NAPROSYN) 375 MG tablet Take 1 tablet (375 mg total) by mouth 2 (two) times daily. 12/19/18   [name], NP  naproxen (NAPROSYN) 500 MG tablet Take 1 tablet (500 mg total) by mouth 2 (two) times daily. 05/11/18   Khatri, Hina, PA-C  ondansetron (ZOFRAN ODT) 4 MG disintegrating tablet Take 1 tablet (4 mg total) by mouth every 8 (eight) hours as needed for nausea or vomiting. 02/06/18   Alvira Monday, MD    Family History No family history on file.  Social History Social History   Tobacco Use  . Smoking status: Never Smoker  . Smokeless tobacco: Never Used  Substance Use Topics  . Alcohol use: No  . Drug use: No     Allergies   Chloroquine   Review of Systems Review of Systems  Cardiovascular: Positive for chest pain and syncope.  All other systems reviewed and are negative.  Physical Exam Updated Vital Signs BP 140/81 (BP Location: Right Arm) Comment: Simultaneous filing. User may not have seen previous data.  Pulse (!) 52 Comment: Simultaneous filing. User may not have seen previous data.  Temp 98.2 F (36.8 C) (Oral)   Resp 20 Comment: Simultaneous filing. User may not have seen previous data.  Ht 5\' 5"  (1.651 m)   Wt 77.1 kg   SpO2 99% Comment: Simultaneous filing. User may not have seen previous data.  BMI 28.29 kg/m   Physical Exam Vitals signs and nursing note reviewed.  Constitutional:      Appearance: She is well-developed.  HENT:     Head: Normocephalic and atraumatic.  Eyes:     Pupils: Pupils are equal, round, and reactive to light.  Neck:     Musculoskeletal: Neck supple.  Cardiovascular:     Rate and Rhythm:  Regular rhythm. Bradycardia present.     Heart sounds: No murmur.  Pulmonary:     Effort: Pulmonary effort is normal. No respiratory distress.     Breath sounds: Normal breath sounds.  Abdominal:     Palpations: Abdomen is soft.     Tenderness: There is no abdominal tenderness. There is no guarding or rebound.  Musculoskeletal:        General: No tenderness.     Comments: 2+ DP pulses and radial pulses bilaterally.  Skin:    General: Skin is warm and dry.  Neurological:     Mental Status: She is alert and oriented to person, place, and time.  Psychiatric:        Behavior: Behavior normal.      ED Treatments / Results  Labs (all labs ordered are listed, but only abnormal results are displayed) Labs Reviewed  COMPREHENSIVE METABOLIC PANEL  CBC WITH DIFFERENTIAL/PLATELET  TROPONIN I  D-DIMER, QUANTITATIVE (NOT AT [facility])    EKG EKG Interpretation  Date/Time:  Saturday February 08 2019 13:46:51 EST Ventricular Rate:  55 PR Interval:    QRS Duration: 89 QT Interval:  429 QTC Calculation: 411 R Axis:   27 Text Interpretation:  Sinus rhythm Abnormal R-wave progression, early transition Confirmed by Tilden Fossa [phone]) on 02/08/2019 1:52:11 PM   Radiology No results found.  Procedures Procedures (including critical care time)  Medications Ordered in ED Medications  sodium chloride 0.9 % bolus 500 mL (has no administration in time range)     Initial Impression / Assessment and Plan / ED Course  I have reviewed the triage vital signs and the nursing notes.  Pertinent labs & imaging results that were available during my care of the patient were reviewed by me and considered in my medical decision making (see chart for details).        Patient here for evaluation of chest pain, syncopal events earlier today. She is non-toxic appearing on evaluation. EKG without acute ischemic changes. D dimer is mildly elevated, plan to obtain CTA to rule out PE. Patient care  transferred pending CTA. Final Clinical Impressions(s) / ED Diagnoses   Final diagnoses:  None    ED Discharge Orders    None       Tilden Fossa, MD 02/08/19 1521

## 2019-02-08 NOTE — ED Provider Notes (Signed)
Patient is a 53 year old female who presents after syncopal episode.  Labs are non-concerning.  CT scan was negative for PE.  Her EKG does not show any ischemic changes.  She is had 2- troponins.  She has had no chest pain since arrival to the ED.  She is feeling better and has no current symptoms.  She was discharged home in good condition.  She was encouraged to follow-up with her primary care provider.  Return precautions were given.  Results for orders placed or performed during the hospital encounter of 02/08/19  Comprehensive metabolic panel  Result Value Ref Range   Sodium 141 135 - 145 mmol/L   Potassium 4.0 3.5 - 5.1 mmol/L   Chloride 105 98 - 111 mmol/L   CO2 25 22 - 32 mmol/L   Glucose, Bld 86 70 - 99 mg/dL   BUN 12 6 - 20 mg/dL   Creatinine, Ser 2.37 0.44 - 1.00 mg/dL   Calcium 9.3 8.9 - 62.8 mg/dL   Total Protein 7.8 6.5 - 8.1 g/dL   Albumin 3.6 3.5 - 5.0 g/dL   AST 24 15 - 41 U/L   ALT 13 0 - 44 U/L   Alkaline Phosphatase 86 38 - 126 U/L   Total Bilirubin 0.6 0.3 - 1.2 mg/dL   GFR calc non Af Amer >60 >60 mL/min   GFR calc Af Amer >60 >60 mL/min   Anion gap 11 5 - 15  CBC with Differential  Result Value Ref Range   WBC 7.6 4.0 - 10.5 K/uL   RBC 4.72 3.87 - 5.11 MIL/uL   Hemoglobin 13.2 12.0 - 15.0 g/dL   HCT 31.5 17.6 - 16.0 %   MCV 87.1 80.0 - 100.0 fL   MCH 28.0 26.0 - 34.0 pg   MCHC 32.1 30.0 - 36.0 g/dL   RDW 73.7 10.6 - 26.9 %   Platelets 365 150 - 400 K/uL   nRBC 0.0 0.0 - 0.2 %   Neutrophils Relative % 48 %   Neutro Abs 3.7 1.7 - 7.7 K/uL   Lymphocytes Relative 37 %   Lymphs Abs 2.8 0.7 - 4.0 K/uL   Monocytes Relative 10 %   Monocytes Absolute 0.7 0.1 - 1.0 K/uL   Eosinophils Relative 4 %   Eosinophils Absolute 0.3 0.0 - 0.5 K/uL   Basophils Relative 1 %   Basophils Absolute 0.1 0.0 - 0.1 K/uL   Immature Granulocytes 0 %   Abs Immature Granulocytes 0.02 0.00 - 0.07 K/uL  Troponin I - Once  Result Value Ref Range   Troponin I <0.03 <0.03 ng/mL   D-dimer, quantitative  Result Value Ref Range   D-Dimer, Quant 0.78 (H) 0.00 - 0.50 ug/mL-FEU  Troponin I - Once  Result Value Ref Range   Troponin I <0.03 <0.03 ng/mL   Dg Chest 2 View  Result Date: 02/08/2019 CLINICAL DATA:  Left-sided chest pain for 2 days with dizziness and shortness of breath EXAM: CHEST - 2 VIEW COMPARISON:  02/06/2018 FINDINGS: The heart size and mediastinal contours are within normal limits. Both lungs are clear. The visualized skeletal structures are unremarkable. IMPRESSION: No active cardiopulmonary disease. Electronically Signed   By: Alcide Clever M.D.   On: 02/08/2019 15:05   Ct Angio Chest Pe W/cm &/or Wo Cm  Result Date: 02/08/2019 CLINICAL DATA:  Intermediate probability for acute pulmonary embolus. Positive D-dimer. EXAM: CT ANGIOGRAPHY CHEST WITH CONTRAST TECHNIQUE: Multidetector CT imaging of the chest was performed using the standard protocol during  bolus administration of intravenous contrast. Multiplanar CT image reconstructions and MIPs were obtained to evaluate the vascular anatomy. CONTRAST:  15mL OMNIPAQUE IOHEXOL 350 MG/ML SOLN COMPARISON:  None FINDINGS: Cardiovascular: Satisfactory opacification of the pulmonary arteries to the segmental level. No evidence of pulmonary embolism. Normal heart size. No pericardial effusion. Mediastinum/Nodes: No enlarged mediastinal, hilar, or axillary lymph nodes. Thyroid gland, trachea, and esophagus demonstrate no significant findings. Lungs/Pleura: Lungs are clear. No pleural effusion or pneumothorax. Upper Abdomen: No acute abnormality. Musculoskeletal: No chest wall abnormality. No acute or significant osseous findings. Review of the MIP images confirms the above findings. IMPRESSION: 1. Negative exam.  No evidence for acute pulmonary embolus. Electronically Signed   By: Signa Kell M.D.   On: 02/08/2019 16:07      Rolan Bucco, MD 02/08/19 [phone]

## 2019-02-08 NOTE — ED Triage Notes (Signed)
Pt arrives in POV with husband from home c/o passing out and chest pain. Pt reports "blacking out" around 1215 today but does not remember for how long. Pt states she then called husband to take her to the hospital. Pt also reports chest pain that radiates down left arm and sometimes feels "tingling" in left arm. Pt a&o x4, VSS at this time.

## 2019-02-08 NOTE — ED Notes (Signed)
Patient transported to CT 

## 2019-02-08 NOTE — ED Notes (Signed)
Pt returned to room from CT

## 2019-02-08 NOTE — ED Notes (Signed)
ED Provider at bedside. 

## 2019-02-08 NOTE — ED Notes (Signed)
Pt transported to xray 

## 2020-01-05 IMAGING — CT CT ANGIO CHEST
2 of 8 series · 18 of 46 positions shown · IV contrast (omnipaque)
Comparison: None

CLINICAL DATA: Intermediate probability for acute pulmonary
embolus. Positive D-dimer.

EXAM:
CT ANGIOGRAPHY CHEST WITH CONTRAST
TECHNIQUE: Multidetector CT imaging of the chest was performed using the
standard protocol during bolus administration of intravenous
contrast. Multiplanar CT image reconstructions and MIPs were
obtained to evaluate the vascular anatomy.
CONTRAST:  75mL OMNIPAQUE IOHEXOL 350 MG/ML SOLN

[Series 6: thins · axial · 0.79mm/px · z∈[+1079,+1339]mm · 15 of 286 slices shown]
[im 13/286  lung]
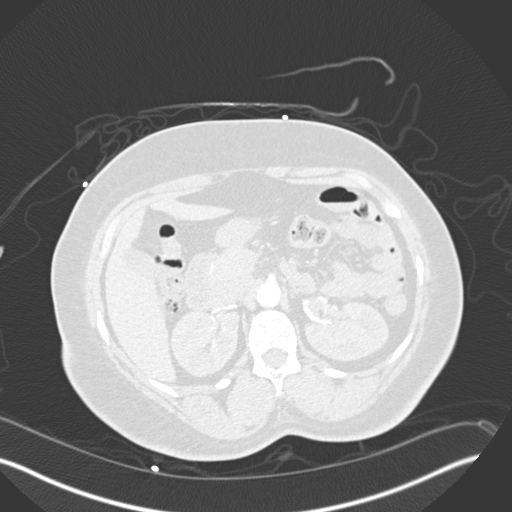
[im 39/286  soft-tissue]
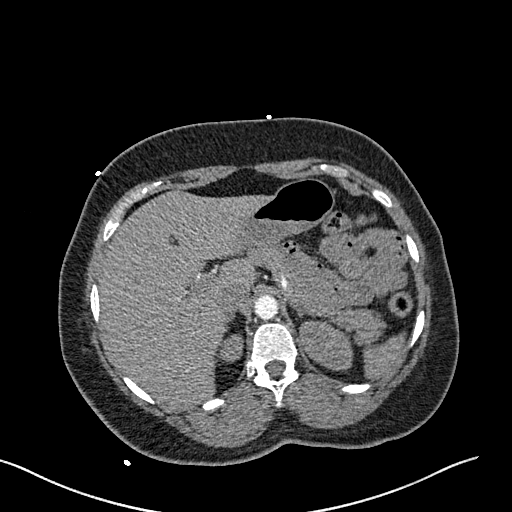
[im 52/286  lung]
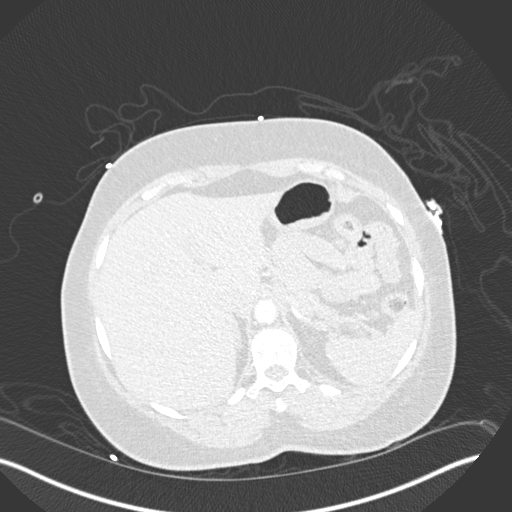
[im 65/286  soft-tissue]
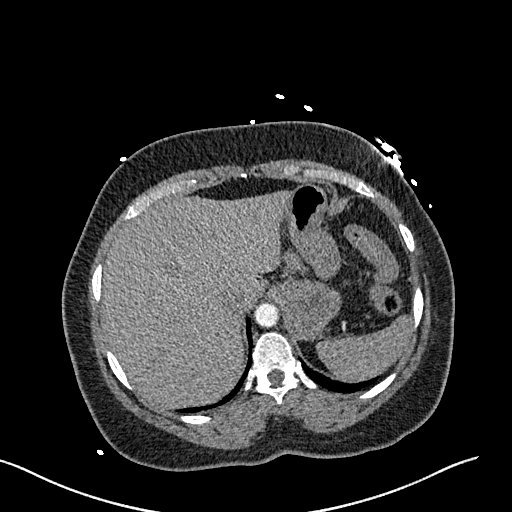
[im 91/286  lung]
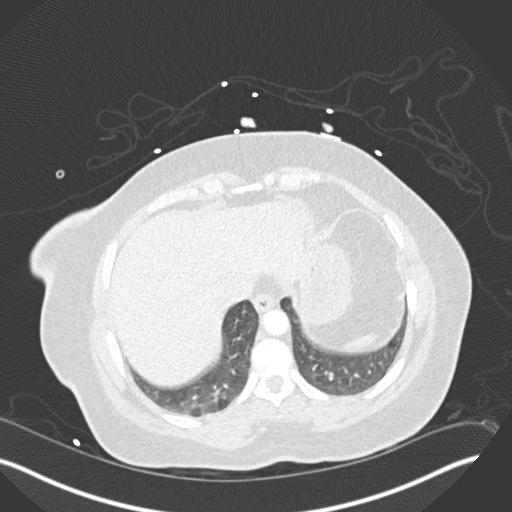
[im 104/286  soft-tissue]
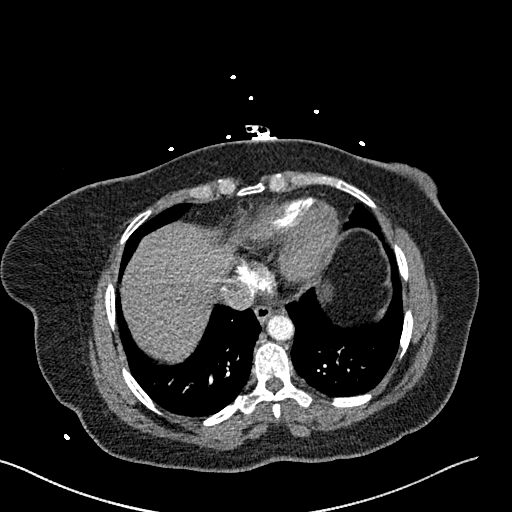
[im 130/286  lung]
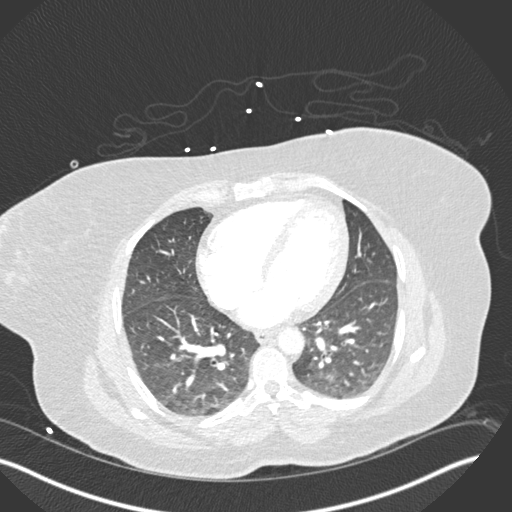
[im 143/286  soft-tissue]
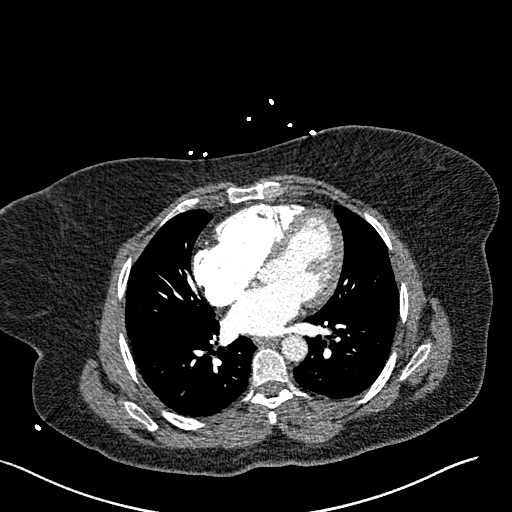
[im 156/286  lung]
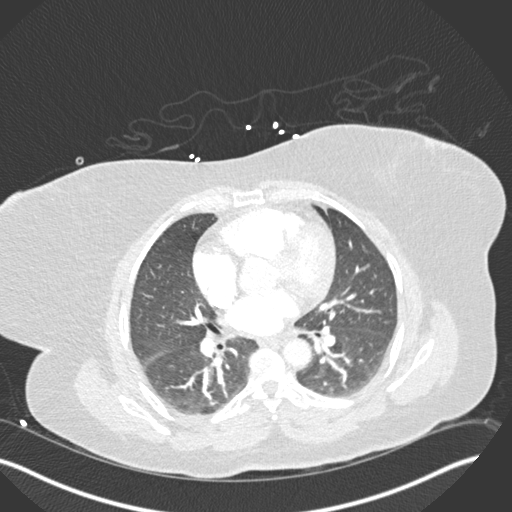
[im 182/286  soft-tissue]
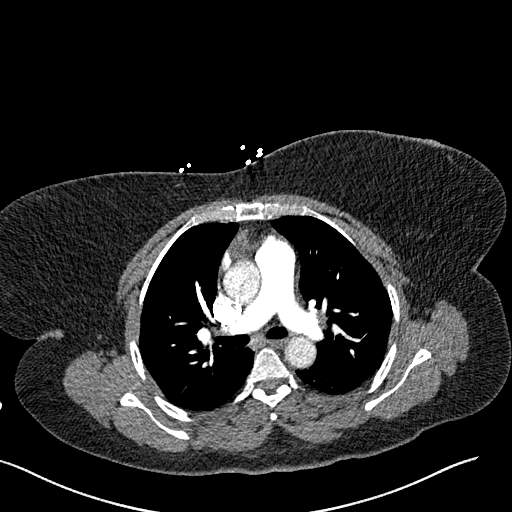
[im 195/286  lung]
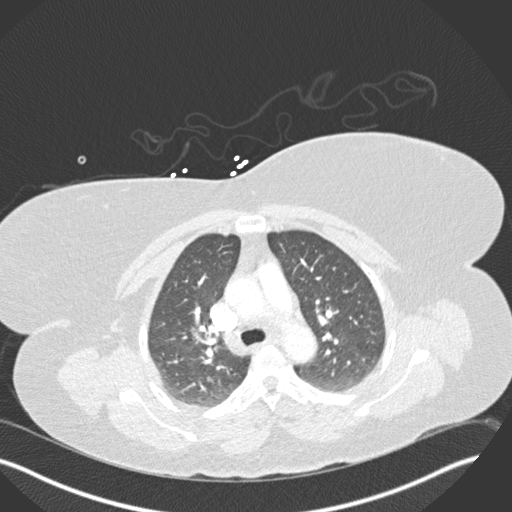
[im 221/286  soft-tissue]
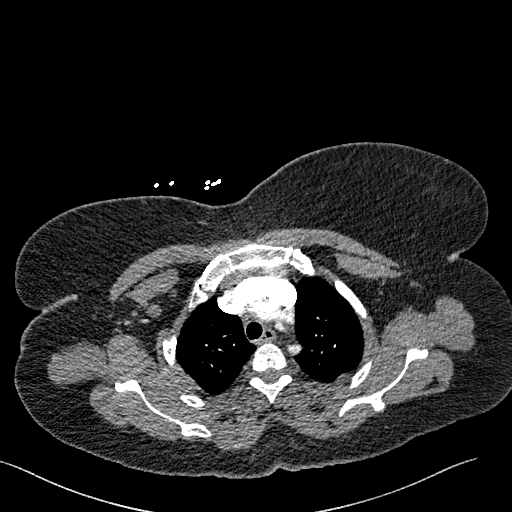
[im 234/286  lung]
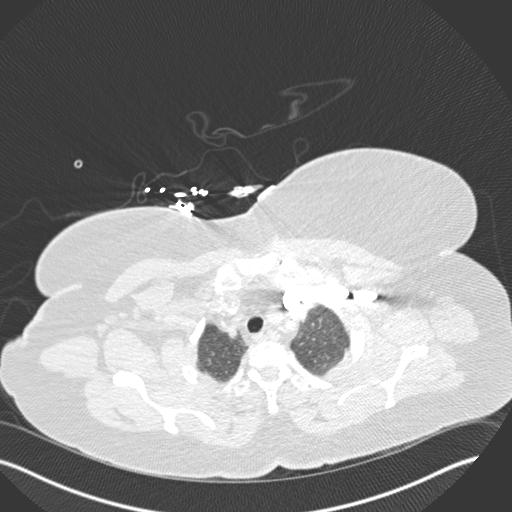
[im 247/286  soft-tissue]
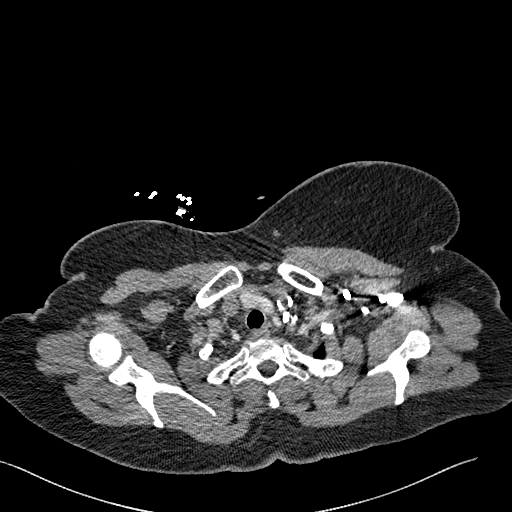
[im 273/286  lung]
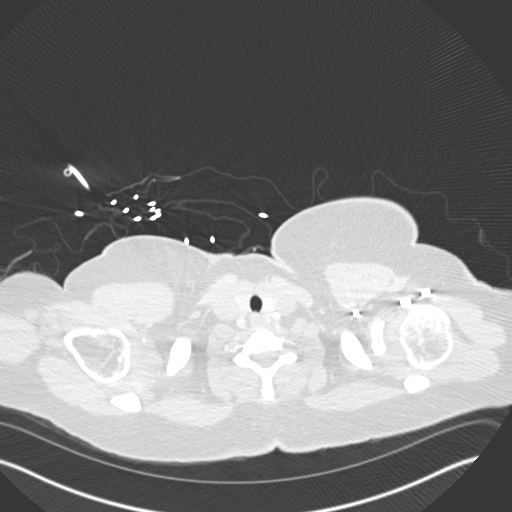

[Series 8: coronal mpr · coronal · 0.55mm/px · 3 of 155 slices shown]
[im 39/155  soft-tissue]
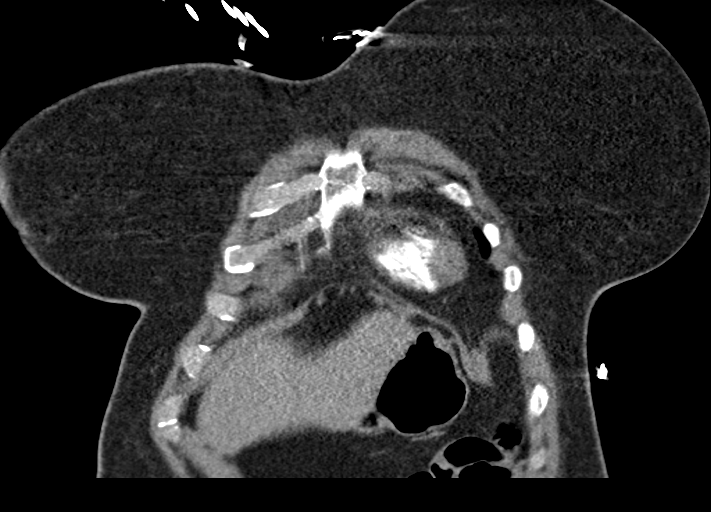
[im 78/155  soft-tissue]
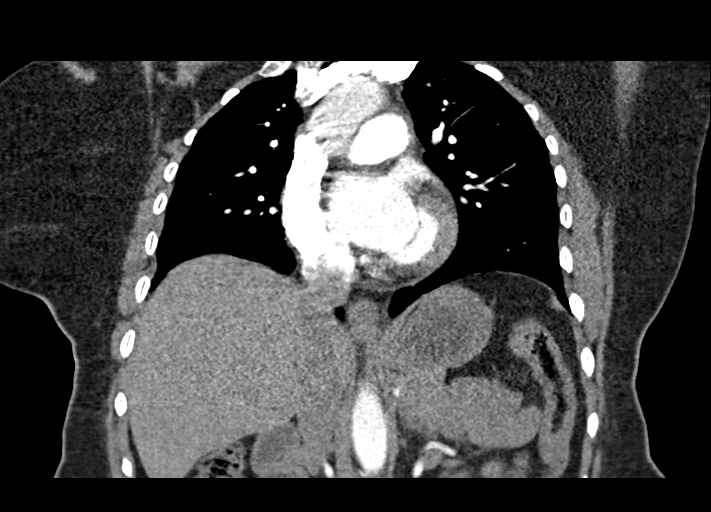
[im 116/155  soft-tissue]
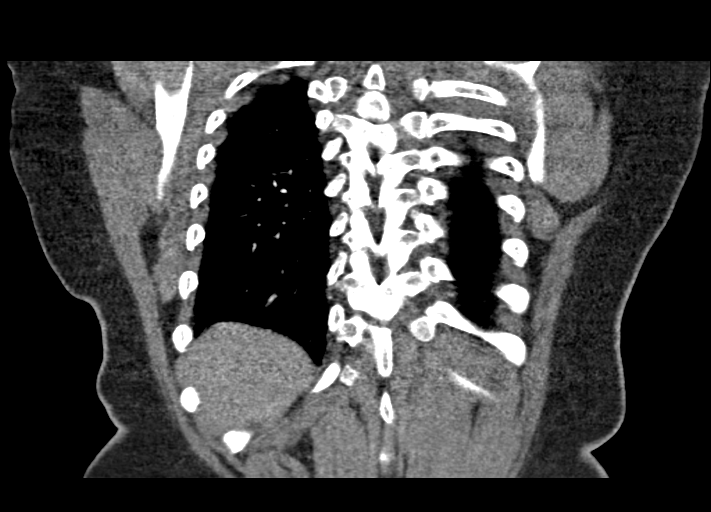

[18 of 46 positions shown; findings below may reference images not displayed]

FINDINGS: Cardiovascular: Satisfactory opacification of the pulmonary arteries
to the segmental level. No evidence of pulmonary embolism. Normal
heart size. No pericardial effusion.

Mediastinum/Nodes: No enlarged mediastinal, hilar, or axillary lymph
nodes. Thyroid gland, trachea, and esophagus demonstrate no
significant findings.

Lungs/Pleura: Lungs are clear. No pleural effusion or pneumothorax.

Upper Abdomen: No acute abnormality.

Musculoskeletal: No chest wall abnormality. No acute or significant
osseous findings.

Review of the MIP images confirms the above findings.
IMPRESSION: 1. Negative exam.  No evidence for acute pulmonary embolus.

## 2020-01-05 IMAGING — CR DG CHEST 2V
2 series · 2 of 2 positions shown · non-contrast
Comparison: 02/06/2018

CLINICAL DATA: Left-sided chest pain for 2 days with dizziness and
shortness of breath

EXAM:
CHEST - 2 VIEW

[chest lat]
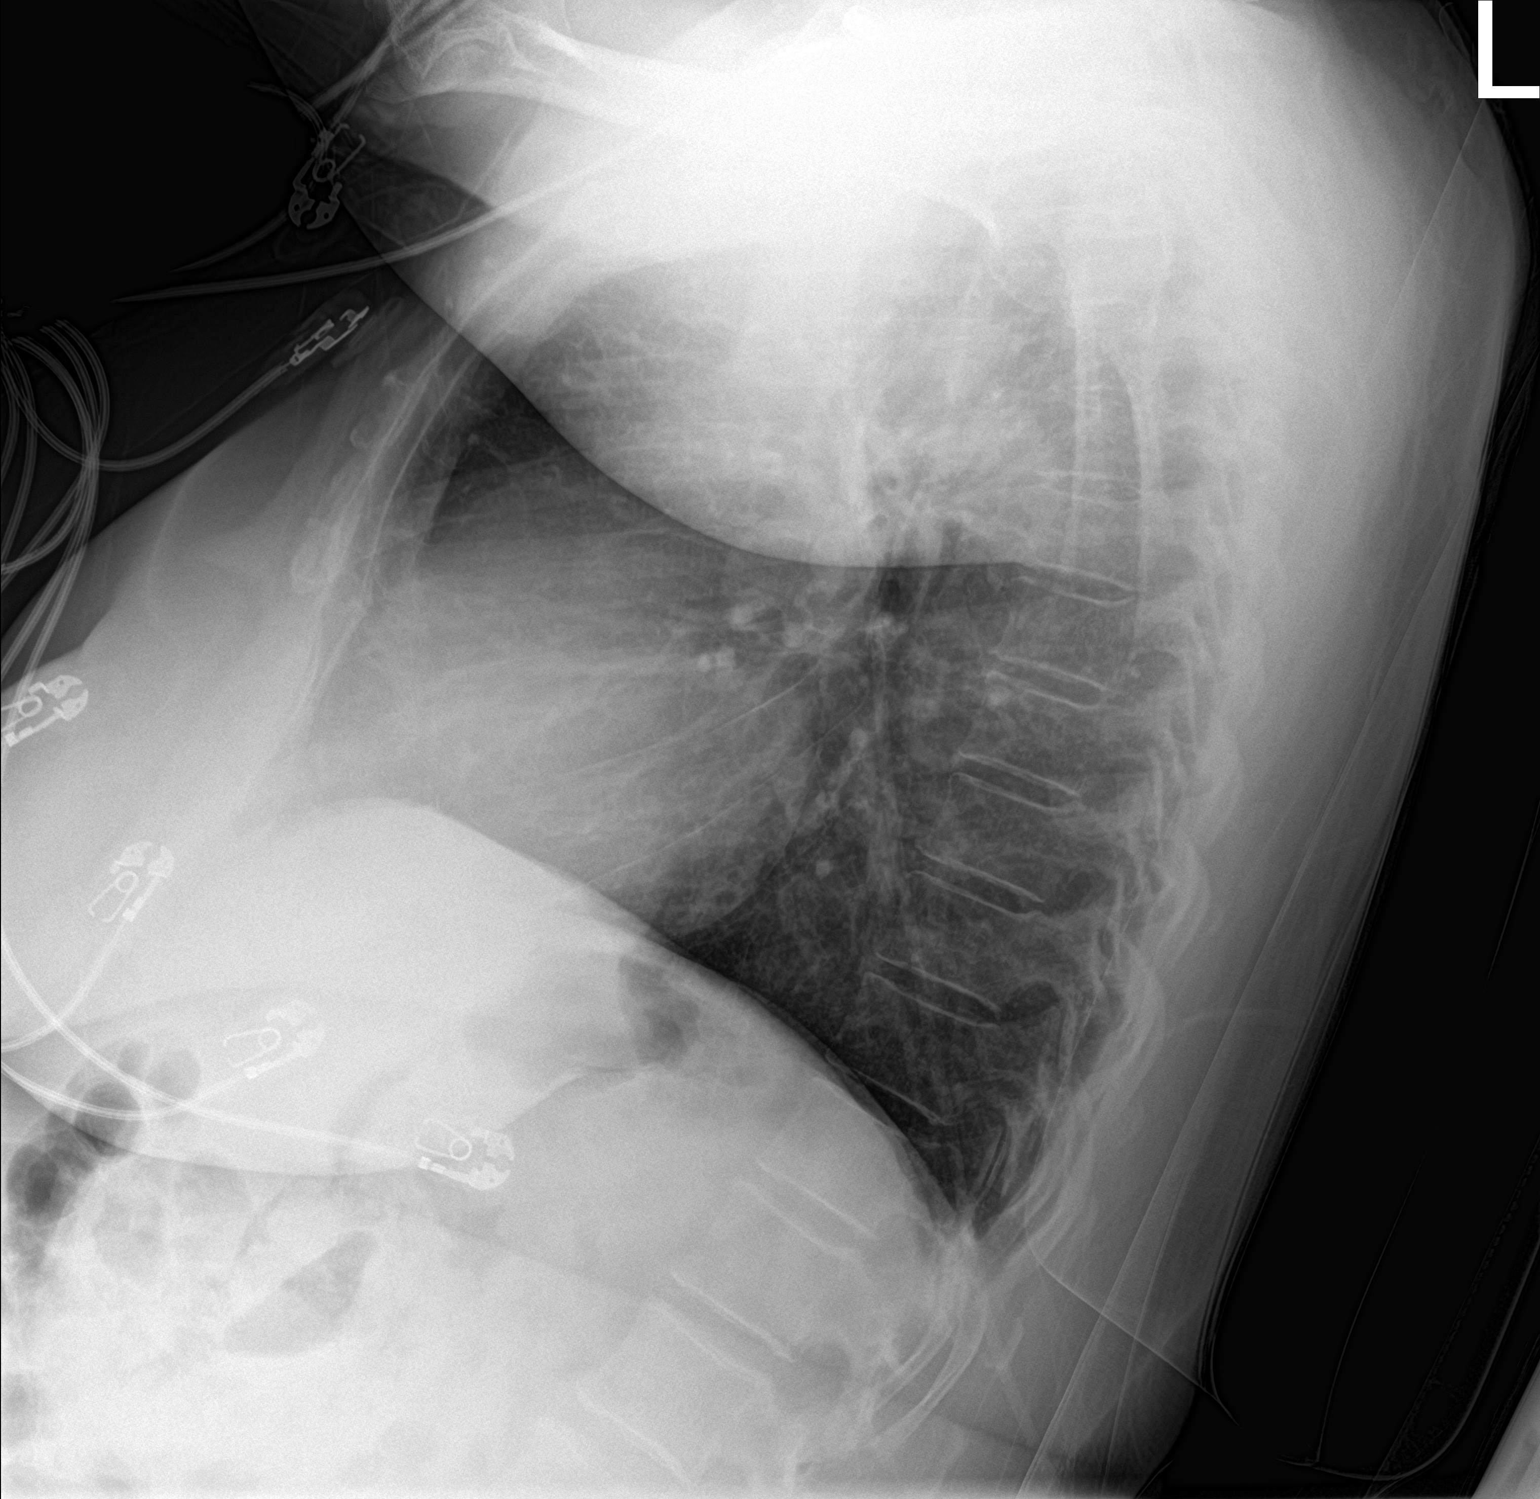

[chest ap]
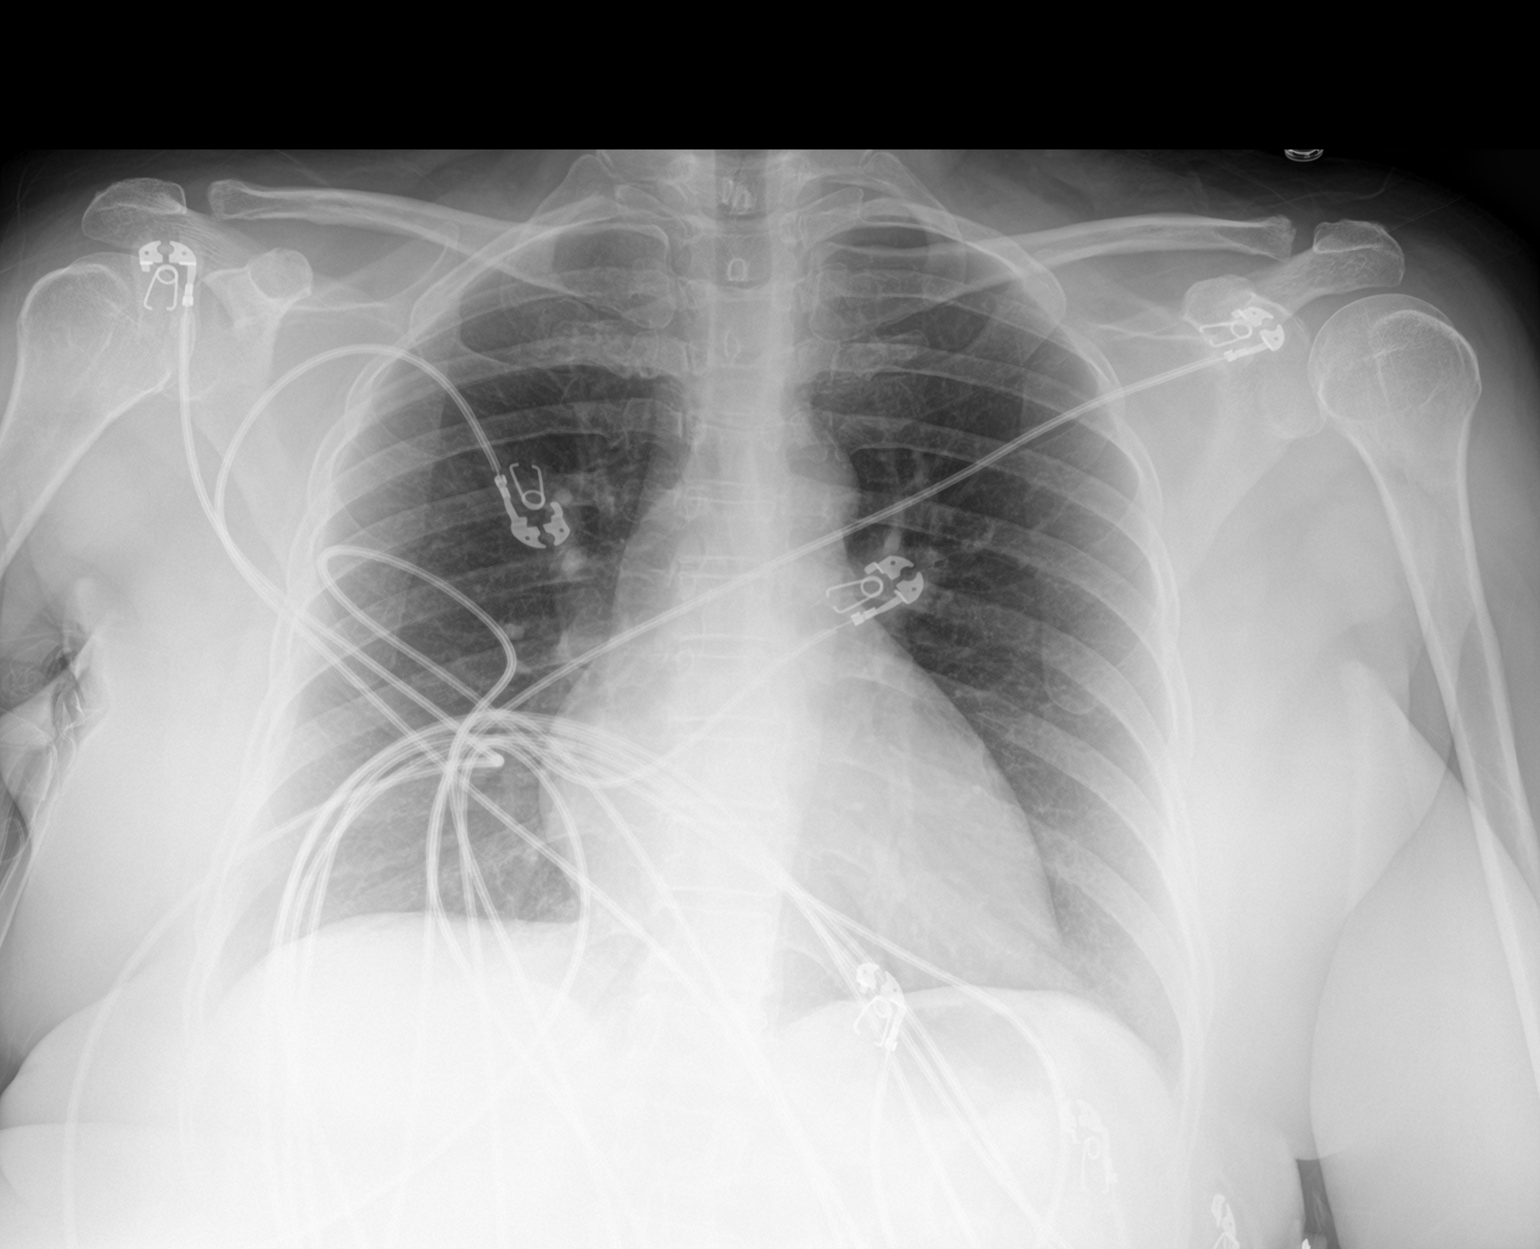

[2 of 2 positions shown; findings below may reference images not displayed]

FINDINGS: The heart size and mediastinal contours are within normal limits.
Both lungs are clear. The visualized skeletal structures are
unremarkable.
IMPRESSION: No active cardiopulmonary disease.

## 2020-12-06 ENCOUNTER — Other Ambulatory Visit: Payer: Self-pay

## 2020-12-06 DIAGNOSIS — Z20822 Contact with and (suspected) exposure to covid-19: Secondary | ICD-10-CM

## 2020-12-07 LAB — SARS-COV-2, NAA 2 DAY TAT

## 2020-12-07 LAB — NOVEL CORONAVIRUS, NAA: SARS-CoV-2, NAA: NOT DETECTED

## 2020-12-12 NOTE — Telephone Encounter (Signed)
 1st attempt- no answer. Left voicemail for patient to call and to  view results in MyChart . SABRA Test results were not left on voicemail Will place 2nd call 01/03

## 2023-12-12 NOTE — Telephone Encounter (Signed)
 Attempted to contact pt regarding a lab error with CMP and lipase. Lab rejected Specimen. Need pt to return to clinic for a redraw. Called home number listed in chart as well as spouses number listed in chart, both numbers were not in service. Unable to reach patient. Notified provider of this.

## 2023-12-12 NOTE — Progress Notes (Signed)
 Patient ID: Rhonda Fernandez is a 58 y.o. female.  Allergies  Allergen Reactions   Chloroquine Itching   Pork Derived (Porcine) Other (See Comments)    Religious reason    Patient Active Problem List  Diagnosis   Primary osteoarthritis of left knee     Chief Complaint  Patient presents with   Shortness of Breath    X 2 days ago pt was awoken from her sleep with SOB and and midline chest and abdominal pains. She has been worked up for this 3 years ago and was Dx with reflux. She has used pepto with little relief     History of Present Illness: Shortness of Breath     58 year old female presenting to urgent care today with complaint of sudden onset, intermittent, sharp, epigastric/substernal chest pain that began 2 days ago. Pt states that it wakes her up out of her sleep. When she wakes up in the morning she feels less rested but is no longer having the pain. She has had similar symptoms ~ 3 years ago and was told that she had acid reflux and was started on Nexium. She states that it did help her symptoms however she stopped taking it and has not had any issues until 2 nights ago. She is currently pain free. She does report when she wakes up she feels short of breath and has also had increased gas/burping. She denies diaphoresis, nausea, vomiting, leg swelling, or any other associated symptoms. No hx of smoking. Denies Fhx of CAD. No hx DVT/PE. No recent prolonged travel or immobilization. No hemoptysis. No active malignancy.   Review of Systems: All others reviewed and negative except as listed above.  Objective: Vitals:   12/12/23 0805  BP: (!) 170/74  Pulse: 64  Resp: 20  Temp: 97.4 F (36.3 C)  TempSrc: Tympanic  SpO2: 100%  Weight: 77.1 kg (170 lb)     Physical Exam: Physical Exam Vitals and nursing note reviewed.  Constitutional:      General: She is not in acute distress.    Appearance: Normal appearance. She is not toxic-appearing.  HENT:     Head: Normocephalic  and atraumatic.     Nose: Nose normal.     Mouth/Throat:     Mouth: Mucous membranes are moist.  Eyes:     Extraocular Movements: Extraocular movements intact.     Conjunctiva/sclera: Conjunctivae normal.  Cardiovascular:     Rate and Rhythm: Normal rate and regular rhythm.  Pulmonary:     Effort: Pulmonary effort is normal.     Breath sounds: Normal breath sounds. No wheezing, rhonchi or rales.  Chest:     Chest wall: No tenderness.  Abdominal:     General: Abdomen is flat.     Tenderness: There is no abdominal tenderness. There is no guarding or rebound.  Musculoskeletal:        General: Normal range of motion.     Cervical back: Normal range of motion.  Skin:    General: Skin is warm and dry.     Findings: No rash.  Neurological:     Mental Status: She is alert. Mental status is at baseline.  Psychiatric:        Mood and Affect: Mood normal.        Behavior: Behavior normal.    Results for orders placed or performed in visit on 12/12/23  Troponin, High Sensitive  Result Value Ref Range   Troponin, High Sensitive 5 <15 ng/L  XR Chest 2 Views  Final Result by Rankin Ina Slice, MD (01/01 9090)  XR CHEST 2 VIEWS, 12/12/2023 8:44 AM    INDICATION: chest pain, SOB mostly at nighttime, Chest pain, unspecified \   R07.9 Chest pain, unspecified   chest pain, SOB mostly at nighttime  COMPARISON: None    FINDINGS:     Cardiovascular: Cardiac silhouette and pulmonary vasculature are within   normal limits.  Mediastinum: Within normal limits.  Lungs/pleura: No pulmonary consolidation or edema. No pleural effusion or   pneumothorax.  Upper abdomen: Visualized portions are unremarkable.   Chest wall/osseous structures: No acute fracture.      IMPRESSION:  There is no evidence of acute cardiac or pulmonary abnormality.         Assessment: Rhonda Fernandez was seen today for shortness of breath.  Diagnoses and all orders for this visit:  Chest pain, unspecified type -      ECG 12 lead -     XR Chest 2 Views -     CBC with Differential -     Comprehensive Metabolic Panel -     Troponin, High Sensitive -     Lipase  Epigastric pain -     CBC with Differential -     Comprehensive Metabolic Panel -     Troponin, High Sensitive -     Lipase  Shortness of breath  Gastroesophageal reflux disease without esophagitis       -     omeprazole (PriLOSEC) 20 mg DR capsule; Take 1 capsule (20 mg total) by mouth in the morning.  Elevated blood pressure reading    Presenting to urgent care today with complaint of a sharp burning sensation to her epigastrium and mid chest that wakes her up out of her sleep for the past 2 nights with associated shortness of breath and increased burping/gas.  History of acid reflux approximately 3 years ago and started on Nexium however stopped taking it after a while.  Does not have PCP to follow-up with at this time.  On arrival to urgent care patient is afebrile, nontachycardic and nontachypneic.  Her blood pressure is noted to be elevated 170/74.  No history of same.  Does not take medication on a daily basis.  Again does not have a PCP and does not get yearly checkups.  Currently she is pain-free.  Overall is well-appearing at this time and in no acute distress.  She has no chest or abdominal tenderness palpation on exam at this time.  No rebound or guarding.  Lungs clear to auscultation bilaterally.  Given complaint of chest pain and shortness of breath as well as epigastric pain will plan for labs including CBC, CMP, lipase, troponin.  Will plan for EKG as well as chest x-ray.  No risk factors concerning for DVT or PE at this time.  Given she denies any pleuritic pain and the pain is not always present/only at nighttime when she is lying down I have low suspicion for same and therefore D-dimer were collected.  No history of congestive heart failure and does not appear fluid overloaded at this time.  No BNP collected today due to same.  Will  plan to start patient on omeprazole daily to help with symptoms.  Have strongly encouraged her to schedule an appointment through the MyChart app to establish care with a PCP for follow-up.  EKG: there are no previous tracings available for comparison, sinus bradycardia with HR 55  CXR clear  Plan to  follow up on labs; if troponin elevated pt will need to go to the ED/if other labs are significantly abnormal including LFT abnormalities or significantly elevated lipase would recommend ED eval as well. Otherwise can follow up with PCP.   Plan: Follow up with PCP    Symptomatic management discussed.  Patient was given verbal and written instructions on symptoms that necessitate return to the UC/ED, and instructed to f/u w/ UC or PCP if not improving in expected timeframe.   Patient/parent has been instructed on RX/OTC medications, dosages, side effects, and possible interactions as associated with each diagnosis in my impression and plan above.   Patient education (verbal/handout) given on diagnosis, pathophysiology, treatment of diagnosis, side effects of medication use for treatment, restrictions while taking medication, supportives measures such as staying hydrated.   Red Flags associated with diagnosis/es were reviewed and patient instructed on action plan if red flags develop.   They have been instructed that if symptoms worsen or red flags develop they should return to Urgent Care, go to the nearest ED, or activate EMS/911.     Patient and/or parent/guardian (if applicable) agreed with plan and voiced understanding.  No barriers to adherence perceived by myself.  During this patient encounter if the patient presented with respiratory complaints and any concern for possible COVID, the patient was wearing a mask. In these cases, throughout the encounter, I was wearing at least a surgical mask as well myself.     Portions of this note may have been dictated using Dragon dictation  software/hardware and may contain grammatical or spelling errors.   Electronically signed by Morgan Browns  Wed 12/12/2023 11:32 AM

## 2024-12-14 ENCOUNTER — Emergency Department (HOSPITAL_BASED_OUTPATIENT_CLINIC_OR_DEPARTMENT_OTHER)
Admission: EM | Admit: 2024-12-14 | Discharge: 2024-12-14 | Disposition: A | Discharge status: Home / Self Care | Payer: Self-pay | Attending: Emergency Medicine | Admitting: Emergency Medicine

## 2024-12-14 ENCOUNTER — Encounter (HOSPITAL_BASED_OUTPATIENT_CLINIC_OR_DEPARTMENT_OTHER): Payer: Self-pay

## 2024-12-14 ENCOUNTER — Emergency Department (HOSPITAL_BASED_OUTPATIENT_CLINIC_OR_DEPARTMENT_OTHER): Payer: Self-pay

## 2024-12-14 ENCOUNTER — Other Ambulatory Visit: Payer: Self-pay

## 2024-12-14 DIAGNOSIS — B028 Zoster with other complications: Secondary | ICD-10-CM | POA: Insufficient documentation

## 2024-12-14 DIAGNOSIS — L03213 Periorbital cellulitis: Secondary | ICD-10-CM | POA: Insufficient documentation

## 2024-12-14 LAB — COMPREHENSIVE METABOLIC PANEL WITH GFR
ALT: 28 U/L (ref 0–44)
AST: 30 U/L (ref 15–41)
Albumin: 4.1 g/dL (ref 3.5–5.0)
Alkaline Phosphatase: 111 U/L (ref 38–126)
Anion gap: 14 (ref 5–15)
BUN: 12 mg/dL (ref 6–20)
CO2: 21 mmol/L — ABNORMAL LOW (ref 22–32)
Calcium: 9.6 mg/dL (ref 8.9–10.3)
Chloride: 106 mmol/L (ref 98–111)
Creatinine, Ser: 0.79 mg/dL (ref 0.44–1.00)
GFR, Estimated: >60 mL/min
Glucose, Bld: 98 mg/dL (ref 70–99)
Potassium: 3.8 mmol/L (ref 3.5–5.1)
Sodium: 141 mmol/L (ref 135–145)
Total Bilirubin: 0.6 mg/dL (ref 0.0–1.2)
Total Protein: 8.4 g/dL — ABNORMAL HIGH (ref 6.5–8.1)

## 2024-12-14 LAB — CBC WITH DIFFERENTIAL/PLATELET
Abs Immature Granulocytes: 0.01 K/uL (ref 0.00–0.07)
Basophils Absolute: 0 K/uL (ref 0.0–0.1)
Basophils Relative: 1 %
Eosinophils Absolute: 0.2 K/uL (ref 0.0–0.5)
Eosinophils Relative: 3 %
HCT: 38.7 % (ref 36.0–46.0)
Hemoglobin: 13.2 g/dL (ref 12.0–15.0)
Immature Granulocytes: 0 %
Lymphocytes Relative: 50 %
Lymphs Abs: 3.5 K/uL (ref 0.7–4.0)
MCH: 28.7 pg (ref 26.0–34.0)
MCHC: 34.1 g/dL (ref 30.0–36.0)
MCV: 84.1 fL (ref 80.0–100.0)
Monocytes Absolute: 0.7 K/uL (ref 0.1–1.0)
Monocytes Relative: 10 %
Neutro Abs: 2.5 K/uL (ref 1.7–7.7)
Neutrophils Relative %: 36 %
Platelets: 332 K/uL (ref 150–400)
RBC: 4.6 MIL/uL (ref 3.87–5.11)
RDW: 14.6 % (ref 11.5–15.5)
WBC: 6.9 K/uL (ref 4.0–10.5)
nRBC: 0 % (ref 0.0–0.2)

## 2024-12-14 MED ORDER — FLUORESCEIN SODIUM 1 MG OP STRP
ORAL_STRIP | OPHTHALMIC | Status: AC
  Filled 2024-12-14: qty 1

## 2024-12-14 MED ORDER — SODIUM CHLORIDE 0.9 % IV BOLUS
500.0000 mL | Freq: Once | INTRAVENOUS | Status: AC
  Administered 2024-12-14: 500 mL via INTRAVENOUS

## 2024-12-14 MED ORDER — HYDROMORPHONE HCL 1 MG/ML IJ SOLN
1.0000 mg | Freq: Once | INTRAMUSCULAR | Status: AC
  Administered 2024-12-14: 1 mg via INTRAVENOUS
  Filled 2024-12-14: qty 1

## 2024-12-14 MED ORDER — ACYCLOVIR 800 MG PO TABS: 400.0000 mg | ORAL_TABLET | Freq: Every day | ORAL | 0 refills | Status: AC

## 2024-12-14 MED ORDER — IOHEXOL 300 MG/ML  SOLN
75.0000 mL | Freq: Once | INTRAMUSCULAR | Status: AC | PRN
  Administered 2024-12-14: 75 mL via INTRAVENOUS

## 2024-12-14 MED ORDER — AMOXICILLIN-POT CLAVULANATE 875-125 MG PO TABS: 1.0000 | ORAL_TABLET | Freq: Two times a day (BID) | ORAL | 0 refills | Status: AC

## 2024-12-14 MED ORDER — SULFAMETHOXAZOLE-TRIMETHOPRIM 800-160 MG PO TABS: 1.0000 | ORAL_TABLET | Freq: Two times a day (BID) | ORAL | 0 refills | Status: AC

## 2024-12-14 MED ORDER — VALACYCLOVIR HCL 500 MG PO TABS
1000.0000 mg | ORAL_TABLET | Freq: Once | ORAL | Status: AC
  Administered 2024-12-14: 1000 mg via ORAL
  Filled 2024-12-14: qty 2

## 2024-12-14 MED ORDER — ONDANSETRON HCL 4 MG/2ML IJ SOLN
4.0000 mg | Freq: Once | INTRAMUSCULAR | Status: AC
  Administered 2024-12-14: 4 mg via INTRAVENOUS
  Filled 2024-12-14: qty 2

## 2024-12-14 NOTE — ED Provider Notes (Signed)
 Pt here w/ facial swelling and pain over the last 4 days. She denies any sig med hx but does not regularly seek primary care.  Denies any history of zoster in the past.  She has a left-sided facial rash consistent with herpes zoster.  She is having some eyelid swelling and some pain with eye movement.  Denies any blurry vision at this time.  Is having an ongoing headache, mostly in the left side of her face.  No hearing changes, no epistaxis or rhinorrhea.  No dysphagia or dysphonia.  -valacyclovir , analgesia for facial discomfort.  Give fluids. -Check CT orbit given pain with eye movement and lid swelling, r/o orbital cellulitis -Stain eye -Visual acuity testing -Consult with ophthalmology      Elnor Jayson LABOR, DO 12/14/24 1215

## 2024-12-14 NOTE — ED Provider Notes (Signed)
 " [place] [facility] Provider Note   CSN: [id number] Arrival date & time: 12/14/24  9063     Patient presents with: Facial Swelling and Headache   Rhonda Fernandez is a 59 y.o. female with a history of osteoarthritis and GERD who presents to the ED with left eye swelling and a facial rash that began 4 days ago.  Patient started experiencing recurrent headaches approximately 1 week ago and the headaches progressed into a left-sided facial rash that surrounds her left eye.  Patient states that the rash is extremely painful in nature. Patient states that she has had increased swelling to the left eye over the last couple of days and this morning she woke up with swelling down into her neck. The patient reports no symptoms such as nausea, vomiting, or diarrhea.  Patient denies any visual changes, however endorses pain on EOM. The patient states that she is unsure about fevers at home however feels like she has had chills/myalgias. The patient is in no acute distress.    HPI     Prior to Admission medications  Medication Sig Start Date End Date Taking? Authorizing Provider  acyclovir  (ZOVIRAX ) 800 MG tablet Take 0.5 tablets (400 mg total) by mouth 5 (five) times daily for 7 days. 12/14/24 12/21/24 Yes Marico Buckle L, PA  amoxicillin -clavulanate (AUGMENTIN ) 875-125 MG tablet Take 1 tablet by mouth 2 (two) times daily for 7 days. 12/14/24 12/21/24 Yes Marquetta Weiskopf L, PA  sulfamethoxazole -trimethoprim  (BACTRIM  DS) 800-160 MG tablet Take 1 tablet by mouth 2 (two) times daily for 7 days. 12/14/24 12/21/24 Yes Jameir Ake L, PA  meclizine  (ANTIVERT ) 25 MG tablet Take 1 tablet (25 mg total) by mouth 3 (three) times daily as needed for dizziness. Patient not taking: Reported on 02/08/2019 02/06/18   Dreama Longs, MD  naproxen  (NAPROSYN ) 375 MG tablet Take 1 tablet (375 mg total) by mouth 2 (two) times daily. Patient not taking: Reported on 02/08/2019 12/19/18   Claudene Lenis, NP   naproxen  (NAPROSYN ) 500 MG tablet Take 1 tablet (500 mg total) by mouth 2 (two) times daily. Patient not taking: Reported on 02/08/2019 05/11/18   Leotis Sole, PA-C  ondansetron  (ZOFRAN  ODT) 4 MG disintegrating tablet Take 1 tablet (4 mg total) by mouth every 8 (eight) hours as needed for nausea or vomiting. Patient not taking: Reported on 02/08/2019 02/06/18   Dreama Longs, MD    Allergies: Chloroquine and Porcine (pork) protein-containing drug products    Review of Systems  HENT:  Positive for facial swelling.   Eyes:  Positive for pain.  Skin:  Positive for rash.    Updated Vital Signs BP 132/67 (BP Location: Right Arm)   Pulse (!) 54   Temp 98.2 F (36.8 C) (Oral)   Resp 14   SpO2 100%   Physical Exam Vitals and nursing note reviewed.  Constitutional:      General: She is not in acute distress.    Appearance: Normal appearance.  HENT:     Head: Normocephalic and atraumatic.     Comments: Rash is erythematous & vesicular in nature following V1 and V2 distribution    Right Ear: Hearing, tympanic membrane and ear canal normal.     Left Ear: Hearing, tympanic membrane and ear canal normal.     Nose: Nose normal.     Mouth/Throat:     Mouth: Mucous membranes are moist. No oral lesions.     Tongue: No lesions.     Pharynx:  Oropharynx is clear. Uvula midline. No pharyngeal swelling or uvula swelling.     Tonsils: No tonsillar abscesses.  Eyes:     General: Vision grossly intact. No visual field deficit.    Intraocular pressure: Left eye pressure is 18 mmHg.     Extraocular Movements: Extraocular movements intact.     Left eye: Normal extraocular motion and no nystagmus.     Conjunctiva/sclera:     Left eye: Left conjunctiva is injected. Chemosis present.     Pupils: Pupils are equal, round, and reactive to light.     Comments: Episcleritis, left lid droop  Neck:     Trachea: Phonation normal. No abnormal tracheal secretions.     Comments: Lymphadenopathy to the left  side of patient's neck. Cardiovascular:     Rate and Rhythm: Normal rate and regular rhythm.     Pulses: Normal pulses.  Pulmonary:     Effort: Pulmonary effort is normal. No respiratory distress.     Breath sounds: Normal breath sounds. No stridor. No wheezing.     Comments: Patient has no difficulty speaking in complete sentences. Musculoskeletal:        General: Normal range of motion.     Cervical back: Full passive range of motion without pain and normal range of motion. Edema present. No crepitus. No pain with movement or spinous process tenderness. Normal range of motion.  Lymphadenopathy:     Cervical: Cervical adenopathy present.     Left cervical: Superficial cervical adenopathy and posterior cervical adenopathy present.  Skin:    General: Skin is warm and dry.     Capillary Refill: Capillary refill takes less than 2 seconds.     Findings: Rash present. Rash is vesicular.  Neurological:     General: No focal deficit present.     Mental Status: She is alert. Mental status is at baseline.     Comments: Patient alert and oriented.  Speech clear and appropriate.  No aphasia or dysarthria.   Cranial nerves III through XII intact: Motor strength 5-5 in all extremities with normal tone and no pronator drift.   Sensation intact to light touch in upper and lower extremities bilaterally.  Coordination normal with intact finger-nose. No focal neurologic deficits appreciated.   Psychiatric:        Mood and Affect: Mood normal.                 (all labs ordered are listed, but only abnormal results are displayed) Labs Reviewed  COMPREHENSIVE METABOLIC PANEL WITH GFR - Abnormal; Notable for the following components:      Result Value   CO2 21 (*)    Total Protein 8.4 (*)    All other components within normal limits  CBC WITH DIFFERENTIAL/PLATELET  CBC WITH DIFFERENTIAL/PLATELET  CBC WITH DIFFERENTIAL/PLATELET    EKG: None  Radiology: CT Orbits W  Contrast Result Date: 12/14/2024 EXAM: CT ORBITS WITH CONTRAST 12/14/2024 12:34:08 PM TECHNIQUE: CT of the orbits was performed with the administration of intravenous contrast. 75 mL of iohexol  (OMNIPAQUE ) 300 MG/ML solution was administered intravenously. Multiplanar reformatted images are provided for review. Automated exposure control, iterative reconstruction, and/or weight based adjustment of the mA/kV was utilized to reduce the radiation dose to as low as reasonably achievable. COMPARISON: CT of the head dated 08/18/2016. CLINICAL HISTORY: Orbital cellulitis suspected. FINDINGS: ORBITS: Globes are intact. Normal extraocular muscles. Normal optic nerve-sheath complexes. No hematoma or inflammatory change. No mass. No post septal soft tissue stranding to suggest  orbital cellulitis. There are no inflammatory changes evident within either orbit. SOFT TISSUES: Left periorbital soft tissue swelling present. SINUSES AND MASTOIDS: Mild mucosal disease present within the ethmoid, maxillary and left sphenoid sinuses. BONES: No acute abnormality. IMPRESSION: 1. No CT evidence of postseptal/orbital cellulitis. 2. Left periorbital (preseptal) soft tissue swelling compatible with periorbital cellulitis. 3. Mild mucosal disease in the ethmoid, maxillary, and left sphenoid sinuses. Electronically signed by: Evalene Coho MD 12/14/2024 12:44 PM EST RP Workstation: [id number]     Procedures   Medications Ordered in the ED  HYDROmorphone  (DILAUDID ) injection 1 mg (1 mg Intravenous Given 12/14/24 1114)  ondansetron  (ZOFRAN ) injection 4 mg (4 mg Intravenous Given 12/14/24 1112)  sodium chloride  0.9 % bolus 500 mL (0 mLs Intravenous Stopped 12/14/24 1358)  valACYclovir  (VALTREX ) tablet 1,000 mg (1,000 mg Oral Given 12/14/24 1112)  fluorescein  1 MG ophthalmic strip (  Given 12/14/24 1116)  iohexol  (OMNIPAQUE ) 300 MG/ML solution 75 mL (75 mLs Intravenous Contrast Given 12/14/24 1225)                                  Medical  Decision Making Amount and/or Complexity of Data Reviewed Labs: ordered. Decision-making details documented in ED Course. Radiology: ordered. Decision-making details documented in ED Course.  Risk Prescription drug management.   Patient presents to the ED for: Rash, left eye swelling This involves an extensive number of treatment options Differential diagnosis includes: Cellulitis vs preseptal cellulitis Herpes zoster /herpes zoster ophthalmicus Other, dermatological etiology  Clinical Course as of 12/14/24 1856  [place] Dec 14, 2024  0943 Temp: 98.7 F (37.1 C) Afebrile, vital stable, patient in no acute distress [ML]  1108 Patient tolerated fluorescein  exam -no pseudo dendrites visualized on exam. [ML]  1216 CBC with Differential/Platelet WNL [ML]  1216 Comprehensive metabolic panel(!) No acute findings [ML]  1230 Comprehensive metabolic panel(!) Patient given valacyclovir , ondansetron , hydromorphone , a small NS bolus-well-tolerated [ML]  1254 CT Orbits W Contrast Left periorbital (preseptal) soft tissue swelling compatible with periorbital cellulitis. [ML]    Clinical Course User Index [ML] Willma Duwaine CROME, [place]    Data Reviewed / Actions Taken: Labs ordered/reviewed with my independent interpretation in ED course above. Imaging ordered/reviewed with my independent interpretation in ED course above. I agree with the radiologists interpretation.   Management / Treatments: See ED course above for medications, treatments administered, and clinical rationale.   Reevaluation of the patient after these medicines showed that the patient improved. I have reviewed the patients home medicines and have made adjustments as needed  ED Course / Reassessments: Problem List: Herpes zoster, preseptal cellulitis 59 year old female presented for left eye swelling and facial rash.  Initial assessment included history, physical exam, and review of prior medical records. Laboratory studies were  without acute finding, imaging was completed due to patient's noted increasing pain on EOM and found to be remarkable for preseptal cellulitis. Management included valacyclovir  dose, antiemetic, analgesics.  Eye exam revealed normal IOP, visual acuity, and no signs of pseudodendrites. Vital signs were obtained and monitored, and the patient remained stable throughout the stay. Given presentation of vesicular papular rash in dermatomal distribution is likely herpes zoster with concern of herpes zoster ophthalmicus.  Dr. Octavia was consulted and agreed with assessing patient in office tomorrow morning. The patient remained stable during the ED course and was deemed appropriate for outpatient management/follow-up with ophthalmology.  Discharge planning include strict return precautions for any new  or worsening symptoms.  The patient was prescribed an antibiotic and antiviral regimen given preseptal cellulitis and herpes zoster infection. The patient was advised on continued supportive care at home, including hydration, rest, and analgesic use. Patient response: Patient's pain improved with ED management Serial reassessments performed: Yes     Consultations:  Ophthalmology - Dr. Octavia  Consult recommendations incorporated into plan: Follow-up tomorrow morning in office for further evaluation.  Disposition: Disposition: Discharge with close follow-up with ophthalmology tomorrow morning in office for further evaluation and care. Rationale for disposition: Stable for discharge. The disposition plan and rationale were discussed with the patient at the bedside, all questions were addressed, and the patient demonstrated understanding.  This note was produced using [employer]. While I have reviewed and verified all clinical information, transcription errors may remain.      Final diagnoses:  Herpes zoster with complication  Preseptal cellulitis of left eye    ED Discharge Orders           Ordered    acyclovir  (ZOVIRAX ) 800 MG tablet  5 times daily        12/14/24 1419    sulfamethoxazole -trimethoprim  (BACTRIM  DS) 800-160 MG tablet  2 times daily        12/14/24 1419    amoxicillin -clavulanate (AUGMENTIN ) 875-125 MG tablet  2 times daily        12/14/24 1419               Hiroko Tregre L, [place] 12/14/24 1858    Elnor Jayson LABOR, DO 12/22/24 0755  "

## 2024-12-14 NOTE — Discharge Instructions (Addendum)
 Thank you for visiting the Emergency Department today. It was a pleasure to be part of your healthcare team.   Your were seen today for left eye pain, swelling, and a rash, and your workup showed herpes zoster virus (shingles) - As discussed, please follow-up with ophthalmology in the morning in office.  You have been treated with antibiotics and antivirals, and you should take your medications as directed. If you have any questions about your medicines, please call your pharmacy or healthcare provider.  It is important to watch for warning signs such as worsening swelling, pain, fever, or vision changes. If any of these happen, return to the Emergency Department or call 911.  Please follow up with your primary care provider within.   Thank you for trusting us  with your health.

## 2024-12-14 NOTE — ED Triage Notes (Signed)
 Reports L eye swelling and facial rash for 4 days. Denies fevers  Also reports intermittent headaches on L side of head for 3 weeks. Denies dizziness, NV  Also reports BIL knee pain and swelling for multiple months. Denies known injury
